# Patient Record
Sex: Male | Born: 1945
Health system: Southern US, Community
[De-identification: ages and names within clinical notes are randomized; demographics above are authoritative.]

## PROBLEM LIST (undated history)

## (undated) DIAGNOSIS — E785 Hyperlipidemia, unspecified: Secondary | ICD-10-CM

## (undated) DIAGNOSIS — K5792 Diverticulitis of intestine, part unspecified, without perforation or abscess without bleeding: Secondary | ICD-10-CM

## (undated) DIAGNOSIS — D126 Benign neoplasm of colon, unspecified: Secondary | ICD-10-CM

## (undated) DIAGNOSIS — I10 Essential (primary) hypertension: Secondary | ICD-10-CM

## (undated) DIAGNOSIS — C439 Malignant melanoma of skin, unspecified: Secondary | ICD-10-CM

## (undated) DIAGNOSIS — T7840XA Allergy, unspecified, initial encounter: Secondary | ICD-10-CM

## (undated) DIAGNOSIS — K5790 Diverticulosis of intestine, part unspecified, without perforation or abscess without bleeding: Secondary | ICD-10-CM

## (undated) DIAGNOSIS — M1990 Unspecified osteoarthritis, unspecified site: Secondary | ICD-10-CM

## (undated) DIAGNOSIS — Z87442 Personal history of urinary calculi: Secondary | ICD-10-CM

## (undated) DIAGNOSIS — C61 Malignant neoplasm of prostate: Secondary | ICD-10-CM

## (undated) DIAGNOSIS — F32A Depression, unspecified: Secondary | ICD-10-CM

## (undated) DIAGNOSIS — F329 Major depressive disorder, single episode, unspecified: Secondary | ICD-10-CM

## (undated) HISTORY — DX: Diverticulosis of intestine, part unspecified, without perforation or abscess without bleeding: K57.90

## (undated) HISTORY — PX: MELANOMA EXCISION: SHX5266

## (undated) HISTORY — DX: Benign neoplasm of colon, unspecified: D12.6

## (undated) HISTORY — DX: Unspecified osteoarthritis, unspecified site: M19.90

## (undated) HISTORY — DX: Major depressive disorder, single episode, unspecified: F32.9

## (undated) HISTORY — DX: Essential (primary) hypertension: I10

## (undated) HISTORY — PX: PROSTATE SURGERY: SHX751

## (undated) HISTORY — DX: Malignant neoplasm of prostate: C61

## (undated) HISTORY — DX: Malignant melanoma of skin, unspecified: C43.9

## (undated) HISTORY — PX: POLYPECTOMY: SHX149

## (undated) HISTORY — DX: Allergy, unspecified, initial encounter: T78.40XA

## (undated) HISTORY — PX: TOTAL KNEE ARTHROPLASTY: SHX125

## (undated) HISTORY — DX: Hyperlipidemia, unspecified: E78.5

## (undated) HISTORY — PX: HEMORRHOID SURGERY: SHX153

## (undated) HISTORY — PX: JOINT REPLACEMENT: SHX530

## (undated) HISTORY — DX: Diverticulitis of intestine, part unspecified, without perforation or abscess without bleeding: K57.92

## (undated) HISTORY — PX: EYE SURGERY: SHX253

## (undated) HISTORY — DX: Depression, unspecified: F32.A

## (undated) HISTORY — PX: COLONOSCOPY: SHX174

## (undated) HISTORY — PX: CYSTOSCOPY: SUR368

---

## 1997-10-21 ENCOUNTER — Ambulatory Visit (HOSPITAL_COMMUNITY): Admission: RE | Admit: 1997-10-21 | Discharge: 1997-10-21 | Payer: Self-pay | Admitting: Urology

## 1997-12-06 ENCOUNTER — Inpatient Hospital Stay (HOSPITAL_COMMUNITY): Admission: RE | Admit: 1997-12-06 | Discharge: 1997-12-09 | Payer: Self-pay | Admitting: Urology

## 2000-02-10 ENCOUNTER — Ambulatory Visit (HOSPITAL_BASED_OUTPATIENT_CLINIC_OR_DEPARTMENT_OTHER): Admission: RE | Admit: 2000-02-10 | Discharge: 2000-02-10 | Payer: Self-pay | Admitting: Internal Medicine

## 2000-08-27 ENCOUNTER — Encounter: Payer: Self-pay | Admitting: Orthopedic Surgery

## 2000-08-29 ENCOUNTER — Encounter: Payer: Self-pay | Admitting: Orthopedic Surgery

## 2000-08-29 ENCOUNTER — Observation Stay (HOSPITAL_COMMUNITY): Admission: RE | Admit: 2000-08-29 | Discharge: 2000-08-30 | Payer: Self-pay | Admitting: Orthopedic Surgery

## 2002-07-01 ENCOUNTER — Encounter: Payer: Self-pay | Admitting: Orthopedic Surgery

## 2002-07-09 ENCOUNTER — Inpatient Hospital Stay (HOSPITAL_COMMUNITY): Admission: RE | Admit: 2002-07-09 | Discharge: 2002-07-13 | Payer: Self-pay | Admitting: Orthopedic Surgery

## 2002-09-21 ENCOUNTER — Ambulatory Visit (HOSPITAL_COMMUNITY): Admission: RE | Admit: 2002-09-21 | Discharge: 2002-09-21 | Payer: Self-pay | Admitting: Orthopedic Surgery

## 2002-09-21 ENCOUNTER — Encounter: Payer: Self-pay | Admitting: Orthopedic Surgery

## 2003-01-21 ENCOUNTER — Ambulatory Visit (HOSPITAL_COMMUNITY): Admission: RE | Admit: 2003-01-21 | Discharge: 2003-01-21 | Payer: Self-pay | Admitting: Internal Medicine

## 2003-08-23 ENCOUNTER — Ambulatory Visit (HOSPITAL_COMMUNITY): Admission: RE | Admit: 2003-08-23 | Discharge: 2003-08-23 | Payer: Self-pay | Admitting: Orthopedic Surgery

## 2004-11-07 ENCOUNTER — Ambulatory Visit: Payer: Self-pay | Admitting: Internal Medicine

## 2004-12-01 ENCOUNTER — Ambulatory Visit: Payer: Self-pay | Admitting: Internal Medicine

## 2005-09-05 ENCOUNTER — Inpatient Hospital Stay (HOSPITAL_COMMUNITY): Admission: RE | Admit: 2005-09-05 | Discharge: 2005-09-08 | Payer: Self-pay | Admitting: Orthopedic Surgery

## 2005-09-05 ENCOUNTER — Encounter (INDEPENDENT_AMBULATORY_CARE_PROVIDER_SITE_OTHER): Payer: Self-pay | Admitting: *Deleted

## 2006-02-20 ENCOUNTER — Ambulatory Visit (HOSPITAL_COMMUNITY): Admission: RE | Admit: 2006-02-20 | Discharge: 2006-02-20 | Payer: Self-pay | Admitting: Orthopedic Surgery

## 2006-11-01 ENCOUNTER — Ambulatory Visit (HOSPITAL_BASED_OUTPATIENT_CLINIC_OR_DEPARTMENT_OTHER): Admission: RE | Admit: 2006-11-01 | Discharge: 2006-11-01 | Payer: Self-pay | Admitting: Orthopedic Surgery

## 2007-07-24 DIAGNOSIS — C61 Malignant neoplasm of prostate: Secondary | ICD-10-CM

## 2007-07-24 HISTORY — DX: Malignant neoplasm of prostate: C61

## 2007-07-24 HISTORY — PX: PROSTATE SURGERY: SHX751

## 2008-05-19 ENCOUNTER — Ambulatory Visit (HOSPITAL_BASED_OUTPATIENT_CLINIC_OR_DEPARTMENT_OTHER): Admission: RE | Admit: 2008-05-19 | Discharge: 2008-05-19 | Payer: Self-pay | Admitting: Orthopedic Surgery

## 2010-04-19 ENCOUNTER — Ambulatory Visit
Admission: RE | Admit: 2010-04-19 | Discharge: 2010-07-18 | Payer: Self-pay | Source: Home / Self Care | Attending: Radiation Oncology | Admitting: Radiation Oncology

## 2010-05-16 ENCOUNTER — Ambulatory Visit (HOSPITAL_COMMUNITY): Admission: RE | Admit: 2010-05-16 | Discharge: 2010-05-16 | Payer: Self-pay | Admitting: Urology

## 2010-08-09 LAB — COMPREHENSIVE METABOLIC PANEL
ALT: 22 U/L (ref 0–53)
AST: 20 U/L (ref 0–37)
Albumin: 4.3 g/dL (ref 3.5–5.2)
Alkaline Phosphatase: 53 U/L (ref 39–117)
BUN: 11 mg/dL (ref 6–23)
CO2: 34 mEq/L — ABNORMAL HIGH (ref 19–32)
Calcium: 10.2 mg/dL (ref 8.4–10.5)
Chloride: 97 mEq/L (ref 96–112)
Creatinine, Ser: 0.91 mg/dL (ref 0.4–1.5)
GFR calc Af Amer: >60 mL/min (ref >60)
GFR calc non Af Amer: >60 mL/min (ref >60)
Glucose, Bld: 156 mg/dL — ABNORMAL HIGH (ref 70–99)
Potassium: 3.8 mEq/L (ref 3.5–5.1)
Sodium: 141 mEq/L (ref 135–145)
Total Bilirubin: 0.6 mg/dL (ref 0.3–1.2)
Total Protein: 6.9 g/dL (ref 6.0–8.3)

## 2010-08-09 LAB — CBC
HCT: 42.7 % (ref 39.0–52.0)
Hemoglobin: 15 g/dL (ref 13.0–17.0)
MCH: 29.6 pg (ref 26.0–34.0)
MCHC: 35.1 g/dL (ref 30.0–36.0)
MCV: 84.4 fL (ref 78.0–100.0)
Platelets: 280 10*3/uL (ref 150–400)
RBC: 5.06 MIL/uL (ref 4.22–5.81)
RDW: 13.5 % (ref 11.5–15.5)
WBC: 7.7 10*3/uL (ref 4.0–10.5)

## 2010-08-09 LAB — PROTIME-INR
INR: 1.03 (ref 0.00–1.49)
Prothrombin Time: 13.7 seconds (ref 11.6–15.2)

## 2010-08-09 LAB — APTT: aPTT: 32 seconds (ref 24–37)

## 2010-08-14 ENCOUNTER — Ambulatory Visit
Admission: RE | Admit: 2010-08-14 | Discharge: 2010-08-14 | Payer: Self-pay | Source: Home / Self Care | Attending: Urology | Admitting: Urology

## 2010-08-16 ENCOUNTER — Ambulatory Visit
Admission: RE | Admit: 2010-08-16 | Discharge: 2010-08-16 | Payer: Self-pay | Source: Home / Self Care | Attending: Urology | Admitting: Urology

## 2010-08-28 NOTE — Op Note (Signed)
NAME:  Donald [name], Donald [name] NO.:  [id number]  MEDICAL RECORD NO.:  [id number]          PATIENT TYPE:  AMB  LOCATION:  [facility]                         FACILITY:  [facility]  PHYSICIAN:  Valetta Fuller, M.D.  DATE OF BIRTH:  [birth year]  DATE OF PROCEDURE: DATE OF DISCHARGE:                              OPERATIVE REPORT   PREOPERATIVE DIAGNOSIS:  Favorable clinical stage T1c adenocarcinoma of the prostate.  POSTOPERATIVE DIAGNOSIS:  Favorable clinical stage T1c adenocarcinoma of the prostate.  PROCEDURES PERFORMED: 1. Iodine-125 prostatic seed implantation via Careers adviser. 2. Flexible cystoscopy.  SURGEONS: 1. Valetta Fuller, M.D. 2. Maryln Gottron, M.D.  ANESTHESIA:  General.  INDICATIONS:  Donald [name] is 65 years of age.  The patient had a mildly elevated PSA with a family history of prostate cancer and a normal digital rectal exam.  The 2 out of 12 biopsy showed low-volume Gleason 3 plus 3 equals 6 cancer.  He was felt to have a low-volume Gleason 6 cancer with relatively low PSA.  He was felt to have favorable clinical stage T1c disease.  We discussed with the patient treatment options including active surveillance.  He was extremely anxious and nervous patient.  He did not feel comfortable without treatment.  We felt that seed implantation was a good compromise treatment for him compared to active surveillance versus robotic surgery.  He was felt to be an excellent candidate for seed implantation and elected to choose that modality.  He was seen in consultation with Dr. Dayton Scrape and after extensive discussion, elected to proceed with that having appeared to understand the advantages, disadvantages, and potential complications. The patient had been scheduled for seed implantation approximately 48 hours earlier but because of extensive stool within the rectal vault, the procedure cannot be done at that time.  He has subsequently done a more  complete bowel prep.  TECHNIQUE AND FINDINGS:  The patient was brought to the operating room. He had successful induction of general anesthesia.  He had PAS compression boots placed and received perioperative ciprofloxacin. Appropriate surgical time-out was performed.  Initial procedure was done by radiation oncology.  Transrectal ultrasound probe was placed in the rectum and anchored to a floor stand.  Foley catheter with contrast in the balloon was placed.  Anchor needles were placed in the prostate. Real time contouring of the rectum, prostate, and urethra were then performed and the dosing parameters established.  We then came to the operating suite and assisted in passage of the needles.  All needle passages were done with real time sagittal ultrasound guidance.  Seed implantation itself was done with the Psychologist, counselling.  A total of 25 activated needles were utilized with a total number of 81 seeds.  Reference dose was 145 Gy.  At the completion of the procedure, the distribution based on fluoroscopic interpretation was excellent.  Foley catheter was removed, and cystoscopy revealed no wayward seeds within the prostate or bladder.  A new Foley catheter was placed which drained clear urine, and the patient was brought to recovery room in stable condition.  Valetta Fuller, M.D.     DSG/MEDQ  D:  08/16/2010  T:  08/16/2010  Job:  [id number]  Electronically Signed by Barron Alvine M.D. on 08/28/2010 65:78:46 AM

## 2010-09-06 ENCOUNTER — Ambulatory Visit: Payer: BC Managed Care – PPO | Attending: Radiation Oncology | Admitting: Radiation Oncology

## 2010-09-06 DIAGNOSIS — C61 Malignant neoplasm of prostate: Secondary | ICD-10-CM | POA: Insufficient documentation

## 2010-09-14 ENCOUNTER — Ambulatory Visit: Payer: BC Managed Care – PPO | Attending: Radiation Oncology | Admitting: Radiation Oncology

## 2010-09-14 DIAGNOSIS — C61 Malignant neoplasm of prostate: Secondary | ICD-10-CM | POA: Insufficient documentation

## 2010-12-05 NOTE — Op Note (Signed)
NAME:  Donald [name], Donald [name]                ACCOUNT NO.:  [id number]   MEDICAL RECORD NO.:  [id number]          PATIENT TYPE:  AMB   LOCATION:  [facility]                         FACILITY:  [facility]   PHYSICIAN:  Ollen Gross, M.D.    DATE OF BIRTH:  March 08, 1946   DATE OF PROCEDURE:  05/19/2008  DATE OF DISCHARGE:                               OPERATIVE REPORT   PREOPERATIVE DIAGNOSES:  Left knee hypertrophic synovitis.   POSTOPERATIVE DIAGNOSES:  Left knee hypertrophic synovitis.   PROCEDURE:  Left knee arthroscopy with synovectomy.   SURGEON:  Ollen Gross, M.D.   ASSISTANT:  None.   ANESTHESIA:  General.   ESTIMATED BLOOD LOSS:  Minimal.   DRAINS:  None.   COMPLICATIONS:  None.   CONDITION:  Stable to recovery room.   INDICATIONS:  [name] is a 65 year old male, complex history in regards to  his left total knee.  He has had the development of hypertrophic  synovitis on two other occasions, treated with arthroscopy with  excellent symptomatic relief, but unfortunately, the tissue has built up  again. He presents now with hypertrophic synovitis with painful popping  of the knee.  He has not had effusions and has not had any signs of  infection.  He presents now for arthroscopy and debridement.   PROCEDURE IN DETAIL:  After successful administration of general  anesthetic, a tourniquet was placed high on the left thigh and left  lower extremity prepped and draped in the usual sterile fashion.  Standard superomedial and inferolateral incisions were made.  Inflow  cannula passed superomedial, camera passed inferolateral.   Arthroscopic visualization proceeds.  The patellar component is well-  seated.  There is a tremendous amount of hypertrophic tissue at the  junction of the quad tendon and the patellar component, as well as in  the medial and lateral gutters and just anterior to the component.  We  created a superolateral portal and placed a shaver in to debride the  hypertrophic  tissue, then used the ArthroCare to remove the remainder of  the hypertrophic tissue to completely clear out the medial and lateral  gutters, as well as the suprapatellar pouch.  In addition, we cleaned  out all the scar tissue in the infrapatellar fat pad and cleared out any  tissue that could impinge in the box of the femoral component.  The  joint was then thoroughly inspected again.  There was no evidence of any  other inflamed tissue and no evidence of any other hypertrophic scar.   At this point, the arthroscopic equipment was removed from the lateral  portals and they were closed with interrupted 4-0 nylon.  Then 20 mL of  0.25% Marcaine with epinephrine were injected through the inflow  cannula, then that was removed and that portal closed with nylon.  I  placed the knee through a range of motion.  There was no more crepitus  at all.  A bulky sterile dressing was then applied and he was placed  into a knee immobilizer, awakened and transported to recovery in stable  condition.      Homero Fellers  Aluisio, M.D.  Electronically Signed     FA/MEDQ  D:  05/19/2008  T:  05/19/2008  Job:  [id number]

## 2010-12-08 NOTE — Op Note (Signed)
NAME:  Donald [name], Donald [name] NO.:  [id number]   MEDICAL RECORD NO.:  [id number]          PATIENT TYPE:  AMB   LOCATION:  [facility]                         FACILITY:  [facility]   PHYSICIAN:  Ollen Gross, M.D.    DATE OF BIRTH:  12-Apr-1946   DATE OF PROCEDURE:  11/01/2006  DATE OF DISCHARGE:                               OPERATIVE REPORT   PREOPERATIVE DIAGNOSIS:  Patellar clunk syndrome, left knee.   POSTOPERATIVE DIAGNOSIS:  Patellar clunk syndrome, left knee.   PROCEDURE:  Left knee arthroscopic with synovectomy.   SURGEON:  Ollen Gross, M.D.   ASSISTANT:  No assistant.   ANESTHESIA:  General.   ESTIMATED BLOOD LOSS:  Minimal.   COMPLICATIONS:  None.   CONDITION:  Stable to recovery room.   BRIEF CLINICAL NOTE:  [name] is a 65 year old male with significant pain  and crepitus, left knee, now 1-1/2 years post total knee arthroplasty.  He had this similar presentation last year.  We did a synovectomy.  He  was asymptomatic for approximately a year and then the popping and pain  recurred.  He presents now for arthroscopy and synovectomy.   PROCEDURE IN DETAIL:  After successful administration of general  anesthetic, a tourniquet was placed high on the left thigh.  The left  lower extremity prepped and draped in the usual sterile fashion.  Standard superomedial and inferolateral incisions were made.  Inflow  canula was passed superomedial, the cameral was passed inferolateral.  Arthroscopic visualization then proceeds.  The junction between the  patellar component and the quad tendon shows an area of very thickened  hypertrophic synovium which is hanging down in the space between the  patellar component and the femoral component, thus causing this  crepitus.  I used the superomedial portal and isolated it with the  spinal needle.  A small incision was made and then this hypertrophic  synovium was debrided back to the quad tendon utilizing a combination of  a 4.2-mm  shaver and the ArthroCare device.  We then inspected the rest  of the joint.  There was a little scar tissue in the intercondylar notch  and that was debrided also with the shaver.  The rest of the joint  looked fine.  We then removed the arthroscopic equipment from the medial  portals and closed them  with interrupted 4-0 nylon.  Twenty cubic centimeters of 0.25% Marcaine  with epinephrine injected through the inflow canula, then that was  removed and that portal closed with nylon.  A bulky sterile dressing was  then applied, and he was awakened and transported to recovery in stable  condition.      Ollen Gross, M.D.  Electronically Signed     FA/MEDQ  D:  11/01/2006  T:  11/01/2006  Job:  [id number]

## 2010-12-08 NOTE — Discharge Summary (Signed)
NAME:  Donald [name], Donald [name]                          ACCOUNT NO.:  [id number]   MEDICAL RECORD NO.:  [id number]                   PATIENT TYPE:  INP   LOCATION:  0471                                 FACILITY:  [facility]   PHYSICIAN:  Vania Rea. Supple, M.D.               DATE OF BIRTH:  [birth year]   DATE OF ADMISSION:  07/09/2002  DATE OF DISCHARGE:  07/13/2002                                 DISCHARGE SUMMARY   ADMISSION DIAGNOSES:  1. End-stage osteoarthrosis to the left knee.  2. Hypertension.  3. Renal calculi.   DISCHARGE DIAGNOSES:  1. End-stage osteoarthrosis to the left knee.  2. Hypertension.  3. Renal calculi.  4. Status post left total knee arthroplasty.  5. Mild postoperative hemorrhagic anemia not requiring transfusion.   OPERATION:  Left total knee arthroplasty.  Surgeon:  Vania Rea. Supple, M.D.  Assistant:  Lucita Lora. Shuford, P.A.-C.  Anesthesia:  General.   HISTORY OF PRESENT ILLNESS:  The patient is a 65 year old male who has had  refractory left knee pain due to osteoarthrosis.  He has been tried on all  conservative measures.  X-rays have shown bone-on-bone changes, and he is  having significant complications with ADLs and pain control.  At this time  total knee replacement arthroplasty was indicated.  The risks and benefits  were discussed at length with the patient.  He wishes to proceed.   HOSPITAL COURSE:  The patient was admitted and underwent the above-noted  procedure and tolerated this well.  All appropriate IV antibiotics and  analgesics were utilized.  Postoperatively he was placed on Coumadin for DVT  and PE prophylaxis.  He was also placed on weightbearing as tolerated.  Hemovac that was placed intraoperatively was removed on postoperative day  #1.  This was done without complications.  Overall, the patient did  extremely well.  He did have a history of sleep apnea; however, his  saturations remained stable on just nasal cannula O2.  His hemoglobin  dropped  to 9.0 postoperatively, and he was asymptomatic.  All in all, the  patient began to progress nicely with therapy.  On postoperative day #2 all  IVs were discontinued.  His medications had to be changed around due to some  nausea associated with Percocet.  Mepergan Fortis and Vicodin were utilized,  and these were more tolerated.  By date July 13, 2002, he had reached  all therapy goals.  He was voiding and had had a bowel movement.  He was  having no further nausea or vomiting.  His t-max was 100.  Incision was  clean and dry, and all other temperatures have been afebrile.  He has  minimal swelling and tolerating p.o. analgesics.  At this time he was felt  stable for discharge to home.  Home health PT and OT and R.N.   LABORATORY DATA:  Admission laboratories:  Hemoglobin 13.9, dropped to 9.0,  and then 9.2 on last draw.  Protimes, INRs followed by pharmacy on Coumadin  for DVT and PE prophylaxis.  See chart for details.  Urinalysis on admission  within normal limits.   EKG on admission:  Normal sinus rhythm.  No significant abnormalities.   Chest x-ray shows no evidence of acute cardiopulmonary disease.  Vague  pleural plaques noted, unchanged from August 27, 2001.  The knee showed  tricompartmental osteoarthritis.   CONDITION ON DISCHARGE:  Stable and improved.   DISPOSITION:  The patient is being discharged to home in the care of his  family.   FOLLOW-UP:  He will follow up two weeks postoperatively.  Call for time.   DISCHARGE INSTRUCTIONS:  Home health PT, OT were arranged.   MEDICATIONS:  1. Resume home medications.  2. Prescriptions were provided for:     A. Coumadin per pharmacy.     B. Vicodin #40, 1-2 every four to six hours p.r.n. pain.     C. Robaxin 500 mg 1 every eight hours p.r.n. spasm, #30.     D. Restoril 15 mg, #30, 1 for sleep.   DIET:  Resume regular diet.      Tracy A. Shuford, P.A.-C.                 Vania Rea. Supple, M.D.    TAS/MEDQ  D:   08/05/2002  T:  08/05/2002  Job:  [id number]

## 2010-12-08 NOTE — Op Note (Signed)
[facility]  Patient:    Donald [name], Donald [name]                       MRN: [id number] Proc. Date: 08/29/00 Adm. Date:  [id number] Disc. Date: [id number] Attending:  Cain Sieve                           Operative Report  PREOPERATIVE DIAGNOSIS:  Left knee medial meniscus tear.  POSTOPERATIVE DIAGNOSES: 1. Left knee medial meniscus tear. 2. Left knee lateral meniscus tear. 3. chondromalacia in all three compartments of the left knee. 4. Diffuse synovitis and multiple intra-articular chondral loose bodies.  OPERATION: 1. Left knee diagnostic arthroscopy. 2. Partial medial meniscectomy. 3. Partial lateral meniscectomy. 4. Tricompartmental chondroplasties. 5. Partial synovectomy and removal of multiple intra-articular cartilaginous    loose bodies.  SURGEON:  Vania Rea. Supple, M.D.  ANESTHESIA:  Laryngeal mask general.  TOURNIQUET TIME:  None was used.  ESTIMATED BLOOD LOSS:  Minimal.  HISTORY:  [name] is an 65 year old gentleman who has had persistent difficulties with left knee pain and mechanical symptoms with clinical examination showing joint line tenderness as well as mild effusion.  MRI scan was performed showing an apparent degenerative tear of the medial meniscus. Due to his ongoing pain and functional limitations, he is brought to the operating room at this time for planned arthroscopic debridement.  Preoperatively, he had been counselled on treatment options as well as risks versus benefits thereof.  Possible complications of bleeding, infection, neurovascular injury, DVT, PE, persistence of pain were all reviewed.  We also discussed arthroscopic surgery would not change his underlying osteoarthrosis. He understands and accepts and agrees with the planned procedure.  DESCRIPTION OF PROCEDURE:  After undergoing routine preoperative evaluation, the patient had a knee block placed in the holding area by the anesthesia department.  He  was then placed supine on the operating table where he underwent induction of laryngeal mask general anesthesia.  The left leg was placed in leg holder and sterilely prepped and draped in standard fashion.  He received 1 g of IV cephalosporin prophylactically.  Standard portals were established, and diagnostic arthroscopy was performed.  Suprapatellar pouch showed diffuse synovitis and multiple chondral loose bodies which were both floating in the joint as well as multiple small pieces which had become attached to the synovial lining.  A shaver was brought in and used to debride these small fragments free from the synovial lining in both the medial and lateral gutters and suprapatellar pouch and diffusely in the anterior chamber. Patellofemoral joint showed diffuse grade III chondromalacia, and this was debrided with the shaver down to stable cartilaginous space.  Intercondylar notch showed intact ligamenta mucosum which was excised with good visualization.  ACL was stable to probing with a negative intraoperative Lachman.  In the medial compartment, there was an extensive degenerative tear of the middle and posterior thirds of the medial meniscus, and this was trimmed back to a stable peripheral margin with the basket, and the shaver was brought in for a final contouring and removal of the meniscal fragments. There was an area of exposed subchondral bone on the posterior aspect of the medial tibial plateau, and this was debrided with the shaver.  Diffuse chondromalacia on the mediofemoral condyle was also debrided.  Laterally, there was an extensive degenerative tear involving the middle and posterior thirds of the lateral meniscus, and this was also trimmed back  to stable peripheral margin with the basket, and the shaver was brought in for final contouring and removal of the meniscal fragments.  Inspection of the posterior medial compartment showed no obvious additional meniscal pathology  or retained fragments.  At this point, a copious irrigation and flushing of the joint was performed to remove any residual chondral loose bodies.  There was some mild chondromalacia on the lateral femoral condyle, and this was also debrided with the shaver as well as small area on the lateral tibial plateau.  At this point, all fluid and instruments were then removed.  The knee joint was instilled with 20 cc of 0.5% Marcaine with epinephrine, 4 mg of morphine and an additional 10 cc of Marcaine with epinephrine was instilled about the portals.  The portals were closed with Steri-Strips; 4 x 4s and ABDs were then wrapped about the knee with cast padding.  The leg was wrapped with an Ace bandage from foot to thigh.  The patient was transferred to the recovery room in stable condition.DD:  08/29/00 TD:  08/30/00 Job: [id number]

## 2010-12-08 NOTE — Discharge Summary (Signed)
NAME:  Donald, [name] NO.:  [id number]   MEDICAL RECORD NO.:  [id number]          PATIENT TYPE:  INP   LOCATION:  1518                         FACILITY:  [facility]   PHYSICIAN:  Ollen Gross, M.D.    DATE OF BIRTH:  [birth year]   DATE OF ADMISSION:  09/05/2005  DATE OF DISCHARGE:  09/08/2005                                 DISCHARGE SUMMARY   ADMISSION DIAGNOSES:  1.  Instability, left total knee arthroplasty.  2.  Hypertension.  3.  Hypercholesterolemia.  4.  History of melanoma.  5.  Nephrolithiasis.  6.  Glaucoma.  7.  Impaired fasting glucose.  8.  Erectile dysfunction.  9.  History of colon polyps, status post polypectomy.  10. Mild benign prostatic hypertrophy.   DISCHARGE DIAGNOSES:  1.  Unstable, loose left total knee, status post left total knee      arthroplasty revision.  2.  Postoperative hypokalemia, improving.  3.  Mild postoperative hyponatremia.  4.  Hypertension.  5.  Hypercholesterolemia.  6.  History of melanoma.  7.  Nephrolithiasis.  8.  Glaucoma.  9.  Impaired fasting glucose.  10. Erectile dysfunction.  11. History of colon polyps, status post polypectomy.  12. Mild benign prostatic hypertrophy.   PROCEDURE:  September 05, 2005, left total knee arthroplasty revision,  surgeon Dr. Lequita Halt, assistant Avel Peace, PA-C.  Anesthesia general with  postop Marcaine pain pump, minimal blood loss.  Tourniquet time 45 minutes,  300 mmHg, down for 10, up an additional 21 minutes at 300 mmHg.   CONSULTS:  None.   BRIEF HISTORY:  Donald [name] is a 64 year old male, who is approximately 3-4 years  out from a left total knee arthroplasty.  He has had recurrent effusions  with significant pain.  Infection has been ruled out on multiple occasions  with bone scan and fluid aspirates.  He does have some instability,  especially in flexion, now presents for revision versus resection  arthroplasty.   LABORATORY DATA:  Preop CBC:  Hemoglobin 14.6,  hematocrit 42.2, normal white  count at 6.3.  Postop hemoglobin 11.8, drifted down to 10.9, last noted H&H  back up to 11.6 and 34.1.  PT/PTT preop 13.9 and 28, respectively and INR of  1.1.  Serial pro times followed.  Last noted PT/INR 26.5, 2.4.  Chem panel  on admission all within normal limits.  Potassium dropped from 3.8 to 3.0,  back up to 3.4.  Sodium dropped from 138 to 136, last noted at 133.  Preop  UA negative.  Blood group/type A positive.  A stat Gram stain taken at time  of surgery, no organisms seen.  Wound culture taken at time of OR September 05, 2005.  Smear showed no organisms, no growth on the culture.  Anaerobic  culture taken at time of surgery, September 05, 2005, Gram smear showed no  organisms with no anaerobes isolated.  Two view chest, August 31, 2005,  suboptimal inspiration, no acute cardiopulmonary disease.   HOSPITAL COURSE:  Admitted to [facility], tolerated the procedure  well, later transferred to the recovery room  and the orthopedic floor, had a  fair amount of pain through the night, was using PCA, started to encourage  p.o. pain medications.  Made sure he was on high dose PCA.  Hemoglobin  looked good postoperatively, and the Hemovac had pulled out and had some  drainage on the dressing. Dressing was changed.  Incision actually looked  very good.  He was diuresing the fluid well. He did have a drop in his  potassium down to 3.0, placed on K-Dur, by the following day was doing a  little bit better with his pain control and actually got up and ambulated 60  feet and later 100 feet.  The pain was improving with p.o. medications.  His  potassium was back up to 3.7.  Pain pump was removed, discontinued his IVs  and Foley.  Continued to progress well with physical therapy and by the  following day of February 17, he was doing well, taking medications by mouth  and was discharged home.   DISCHARGE PLAN:  1.  The patient discharged home on September 08, 2005.  2.  Discharge diagnoses, please see above.  3.  Discharge medications:  Coumadin, Percocet, Robaxin, Restoril.  4.  Diet:  Resume previous home diet.   ACTIVITY:  Weightbearing as tolerated, total knee protocol, home health PT,  home health nursing.   DISPOSITION:  Home.   CONDITION ON DISCHARGE:  Improving.      Alexzandrew L. Julien Girt, P.A.      Ollen Gross, M.D.  Electronically Signed    ALP/MEDQ  D:  10/11/2005  T:  10/12/2005  Job:  [id number]   cc:   Loraine Leriche A. Perini, M.D.  Fax: [phone]

## 2010-12-08 NOTE — Op Note (Signed)
NAME:  CHUNG, CHAGOYA NO.:  [id number]   MEDICAL RECORD NO.:  [id number]          PATIENT TYPE:  AMB   LOCATION:  DAY                          FACILITY:  [facility]   PHYSICIAN:  Ollen Gross, M.D.    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   POSTOPERATIVE DIAGNOSIS:  Left knee patellar clunk syndrome with  hypertrophic synovitis.   OPERATION PERFORMED:  Left knee arthroscopy with synovectomy.   SURGEON:  Ollen Gross, M.D.   ANESTHESIA:  General.   ESTIMATED BLOOD LOSS:  Minimal.   DRAINS:  None.   COMPLICATIONS:  None.  Condition stable to recovery.   INDICATIONS FOR PROCEDURE:  Nikesh is a 65 year old male with left total knee  arthroplasty revision approximately six months ago.  He has been doing well  from a pain standpoint but has had a marked crepitance on range of motion of  the knee.  This is all consistent with the patellar clunk syndrome. He  presents now for arthroscopy and debridement.   DESCRIPTION OF PROCEDURE:  After successful administration of general  anesthetic, a tourniquet was placed high on his left thigh and left lower  extremity prepped and draped in the usual sterile fashion.  Standard  superomedial and inferolateral incisions were made.  An inflow cannula  passed superomedial, camera passed inferolateral, arthroscopic visualization  proceeds.  He had evidence of a large synovitic nodule at the junction of  the quadriceps tendon and patellar component.  I subsequently switched the  inflow through an inferomedial portal and placed the ArthroCare device in  the superomedial portal.  Using a combination of the ArthroCare device and  the 4.2 mm shaver, I was able to debride this back to normal-appearing  quadriceps tendon.  We then sealed that off with the  ArthroCare.  I then  removed the arthroscopic equipment and placed him through a range of motion  and there was no further crepitance.  I again inspected the knee and there  was some more synovitic type tissue just adjacent to the  patella laterally and medially which was debrided.  We subsequently removed  the arthroscopic equipment and injected 20 mL of 0.25% Marcaine with  epinephrine into the joint.  The incisions were closed with interrupted 4-0  nylon and a bulky sterile dressing was applied.  He was awakened and  transported to recovery in stable condition.      Ollen Gross, M.D.  Electronically Signed     FA/MEDQ  D:  02/20/2006  T:  02/21/2006  Job:  [id number]

## 2010-12-08 NOTE — Op Note (Signed)
NAME:  CLEMENS, LACHMAN NO.:  [id number]   MEDICAL RECORD NO.:  [id number]          PATIENT TYPE:  INP   LOCATION:  0009                         FACILITY:  [facility]   PHYSICIAN:  Ollen Gross, M.D.    DATE OF BIRTH:  Dec 17, 1945   DATE OF PROCEDURE:  09/05/2005  DATE OF DISCHARGE:                                 OPERATIVE REPORT   PREOPERATIVE DIAGNOSIS:  Unstable loose left total knee arthroplasty.   POSTOPERATIVE DIAGNOSIS:  Unstable loose left total knee arthroplasty.   PROCEDURE:  Left total knee arthroplasty revision.   SURGEON:  Ollen Gross, M.D.   ASSISTANT:  Alexzandrew L. Julien Girt, P.A.   ANESTHESIA:  General with postop Marcaine pain pump.   ESTIMATED BLOOD LOSS:  Minimal.   DRAINS:  Hemovac x1.   TOURNIQUET TIME:  45 minutes at 300 mmHg, down 10 minutes and then up an  additional 21 minutes at 300 mmHg.   COMPLICATIONS:  None.   CONDITION:  Stable to recovery.   CLINICAL NOTE:  Jamile is a 65 year old male whose approximately 3-4 years  out from a left total knee arthroplasty. He has had problems with recurrent  effusions and significant pain. Infection has been ruled out on multiple  occasions with bone scan and fluid aspirates. He does have some instability  especially in flexion. He presents now for total knee arthroplasty revision  first versus resection arthroplasty depending on intraoperative findings.   PROCEDURE IN DETAIL:  After successful administration of general anesthetic  a tourniquet was placed high on the left thigh and left lower extremity  prepped and draped in the usual sterile fashion. He did not receive  preoperative antibiotics so we can get an accurate culture. The extremity is  wrapped in Esmarch, knee flexed, tourniquet inflated to 300 mmHg. A midline  incision is made with a 10 blade through the subcutaneous tissue to the  level of the extensor mechanism. A fresh blade is used to make a medial  parapatellar  arthrotomy. A moderate amount of clear synovial fluid is  identified and sent for stat Gram stain which was negative. The soft tissue  over the proximal medial tibia is subperiosteally elevated to the joint line  with a knife and into the semimembranosus bursa with a Cobb elevator. The  lateral tissue is also elevated with attention being paid to avoiding the  patellar tendon on the tibial tubercle. The patella was everted, knee flexed  90 degrees. I removed the scar from the suprapatellar pouch. Synovium is  also sent for stat frozen section which also showed no acute inflammation.  The tibial polyethylene is removed. On the femoral side, I easily disrupted  the interface between the prosthesis and bone and removed it with minimal if  any bone loss. On the tibial side, the component was found to be loose. We  disrupted the interface and easily removed the tibial component. There was  some motion present and easy removal was achieved. The cement is then  removed from the tibial canal. Both the femoral and tibial canals are  thoroughly irrigated. We reamed on  the tibial side up to 16 mm and on the  femoral side up to 22 mm.   The tibia subluxed forward and the 16 mm reamer placed. The intramedullary  cutting guide is placed to remove approximately 2-3 mm of bone off the cut  surface. The proximal tibia is then prepared with the proximal drill/reamer.  This was for the MBT revision tray. We placed a trial size 4 tray with a 13  x 60 stem extension and had good fit. We then did the keel punch for that.   On the femoral side, we placed a 22 mm intramedullary reamer and did our  cutting off of that. A distal femoral cutting block is placed to remove  about 2 mm off the distal femur. This was done in 5 degrees of valgus. We  placed a 12.5 mm spacer block and then placed the AP cutting block for the  size 4. We used a spacer block to gauge our rotation on the femoral side to  create a  rectangular flexion gap. We placed the stem in a +2 position to  effectively raise the stem and lower the anterior flange of the component.  Anterior, there is no bone removed and posterior we did remove some bone,  especially posterolateral. The revision block is then placed and the chamfer  and intercondylar cut made.   We set our trials with the tibial trial being a size 4 MBT revision tray  with a 13 x  60 stem extension. On the femoral side, it was a size four  posterior stabilized femur with the adapter in the +2 position and the 22 x  75 stem extension placed. We got up to a 22.5 spacer which I did not want to  go that high so we placed 10 mm augments on the medial and lateral side on  the tibia. When we did this, we retrialed and did a 12.5 spacer with  outstanding balance in full extension through 125-135 degrees of flexion. .  I just had to remove some bone around the patella and smooth out the tissue  adjacent to it and thus performed patellaplasty. The component was in good  position and was not worn. We then released the tourniquet for the first  tourniquet time of 45 minutes. The tourniquet was down for 10 minutes while  we assembled the components on the back table. After 10 minutes, we  rewrapped in Esmarch and reinflated to 300 mmHg. I trialed the cement  restricter for the tibial side and size 6 was the most appropriate. It was  placed at the appropriate depth. The pulsatile lavage was down, he received  a gram of IV Ancef. The cement is mixed and once ready for implantation, it  is injected into the tibial canal and pressurized. The tibial component is  then cemented into place and then the femoral component is cemented distally  with a Press-Fit stem. Both components are impacted, a trial 12.5 insert is  placed, knee held in full extension and all extruded cement removed. Once  the cement is fully hardened then the permanent 12.5 mm posterior stabilized rotating  platform insert is placed in the tibial tray. Once again, there is  outstanding stability throughout full range of motion. Flexion against  gravity showed about 140 degrees with excellent tracking of the patella. The  wound is then copiously irrigated with saline solution. The extensor  mechanism closed against gravity as stated is about 140 degrees. Subcu is  closed with interrupted 2-0 Vicryl and skin with staples. A catheter for the  Marcaine pain pump is placed and the pump was initiated. Hemovac is hooked  to suction, a bulky sterile dressing applied and he is placed into a knee  immobilizer, awakened and transported to recovery in stable condition.      Ollen Gross, M.D.  Electronically Signed     FA/MEDQ  D:  09/05/2005  T:  09/06/2005  Job:  [id number]

## 2010-12-08 NOTE — H&P (Signed)
NAME:  Donald [name], Donald [name] NO.:  [id number]   MEDICAL RECORD NO.:  [id number]          PATIENT TYPE:  INP   LOCATION:  NA                           FACILITY:  [facility]   PHYSICIAN:  Ollen Gross, M.D.    DATE OF BIRTH:  November 10, 1945   DATE OF ADMISSION:  09/05/2005  DATE OF DISCHARGE:                                HISTORY & PHYSICAL   CHIEF COMPLAINT:  Left knee pain.   HISTORY OF PRESENT ILLNESS:  A 65 year old male well known to Dr. Ollen Gross, having previously undergone a left total knee angioplasty.  Unfortunately, the left knee has developed a great deal of instability.  He  has been workup up for infection several times in the past which have all  been negative, although he continues to have moderate instability.  He  continues to have an effusion.  It was initially felt he either had a low-  grade infection or instability.  He has reached a point where he is having  difficulty with mobility and transfer and also continued difficulty with  effusions and swelling at the end of the day.  He has reached a point where  he would like to have something done about it.  Risks and benefits of  revision have been discussed with him.  The patient does understand the  possibility of infection requiring resection arthroplasty in two-stage  procedure versus full revision for instability.  Risks and benefits  discussed.  The patient will subsequently be admitted to hospital.  Seen by  Dr. Waynard Edwards preoperatively and considered low risk for perioperative  cardiovascular event.   ALLERGIES:  No known drug allergies.   CURRENT MEDICATIONS:  1.  Lipitor 10 mg.  2.  Lotrel.  3.  He is also on a diuretic.  4.  Aspirin 1 daily.  5.  Vicodin.   PAST MEDICAL HISTORY:  1.  Hypertension.  2.  Hypercholesterolemia.  3.  History of melanoma excision.  4.  Nephrolithiasis.  5.  Glaucoma.  6.  Impaired fasting glucose.  7.  Erectile dysfunction.  8.  Colon polyps.  9.  Mild  BPH.  10. History of immunotherapy after melanoma excision.   PAST MEDICAL HISTORY:  1.  Surgical excision of melanoma.  2.  Colonoscopy.  3.  Polypectomy.  4.  Left total knee angioplasty.   SOCIAL HISTORY:  Married, 2 children.  Denies tobacco products, seldom  intake of alcohol.  He owns a Engineer, drilling.   FAMILY HISTORY:  Father deceased with history of AAA. Uncle deceased with  history of AAA.  Mother living at age 69 with history of MI.   REVIEW OF SYSTEMS:  GENERAL: No fever or night sweats. NEUROLOGIC: No  seizures, stroke, paralysis. RESPIRATORY: No shortness of breath, productive  cough, hemoptysis. CARDIOVASCULAR: No chest pain, orthopnea. GI: No nausea,  vomiting, diarrhea, constipation. GU: No dysuria, hematuria, discharge.  MUSCULOSKELETAL: Left knee.   PHYSICAL EXAMINATION:  VITAL SIGNS: Pulse 84, respirations 12, blood  pressure 148/82.  GENERAL: The patient is a 65 year old white male, well-nourished, well-  developed, no acute  distress.  He is alert, oriented, cooperative, good  historian.  Has somewhat of a flat affect during the exam.  HEENT:  Normocephalic and atraumatic.  Pupils equal, round, and reactive.  Oropharynx clear. EOMs intact.  NECK:  Supple.  CHEST: Clear.  HEART:  Regular rate and rhythm, no murmur.  ABDOMEN: Soft, nontender.  Bowel sounds present.  RECTAL/BREASTS/GENITALIA: Not done, not pertinent to present illness.  EXTREMITIES:  Left knee range of motion 0 to 120 degrees, moderate  instability, more in flexion than extension.  He has a lot of AP play in  flexion.   IMPRESSION:  1.  Instability left total knee angioplasty.  2.  Hypertension.  3.  Hypercholesterolemia.  4.  History of melanoma.  5.  Nephrolithiasis.  6.  Glaucoma.  7.  Impaired fasting glucose.  8.  Erectile dysfunction.  9.  History of colon polyps status post polypectomy.  10. Mild benign prostatic hypertrophy.   PLAN:  Patient admitted to [facility] to  undergo a left total knee  resection versus left total knee angioplasty revision.  Surgery will be  performed by Dr. Ollen Gross.      Alexzandrew L. Julien Girt, P.A.      Ollen Gross, M.D.  Electronically Signed    ALP/MEDQ  D:  09/04/2005  T:  09/04/2005  Job:  [id number]   cc:   Loraine Leriche A. Perini, M.D.  Fax: [phone]

## 2010-12-08 NOTE — Op Note (Signed)
[facility]  Patient:    Donald [name], Donald [name]                       MRN: [id number] Proc. Date: 08/29/00 Adm. Date:  [id number] Disc. Date: [id number] Attending:  Cain Sieve                           Operative Report  ADDENDUM  DESCRIPTION OF PROCEDURE:  Was dictated:  There was exposed subchondral bone on the medial tibial plateau.  This should be corrected to read that there was grade III chondromalacia of the medial tibial plateau.  There was, however, no obvious exposed subchondral bone. DD:  08/29/00 TD:  08/30/00 Job: [id number]

## 2010-12-08 NOTE — Op Note (Signed)
NAME:  Donald [name], Donald [name]                          ACCOUNT NO.:  [id number]   MEDICAL RECORD NO.:  [id number]                   PATIENT TYPE:  INP   LOCATION:  X001                                 FACILITY:  [facility]   PHYSICIAN:  Vania Rea. Supple, M.D.               DATE OF BIRTH:  April 16, 1946   DATE OF PROCEDURE:  07/09/2002  DATE OF DISCHARGE:                                 OPERATIVE REPORT   PREOPERATIVE DIAGNOSIS:  End-stage left knee osteoarthrosis.   POSTOPERATIVE DIAGNOSIS:  End-stage left knee osteoarthrosis.   PROCEDURE:  A cemented [employer]ics Scorpio posterior-stabilized total knee  arthroplasty, utilizing a size 9 femur, a size 9 tibia, a 12 mm thick  polyethylene insert, and a 26 mm patella.   SURGEON OF RECORD:  Vania Rea. Supple, M.D.   Threasa HeadsFrench Ana A. Shuford, P.A.-C.   ANESTHESIA:  General endotracheal.   TOURNIQUET TIME:  60 minutes.   ESTIMATED BLOOD LOSS:  200 cc.   DRAINS:  Hemovac x 1.   HISTORY:  Mr. Mulrooney is a 65 year old gentleman, who has had a long history  of left knee pain, swelling, and mechanical symptoms with known  osteoarthrosis based on previous arthroscopic findings.  He is now to the  point where the significant pain and functional limitation are causing a  marked diminution in his quality of life, and he is brought to the operating  room at this time for planned left total knee arthroplasty.   Preoperatively, Mr. Mcquitty had been counseled on treatment options as well  as risks versus benefits thereof.  Possible surgical complications of  bleeding, infection, neurovascular injury, DVT, PE, as well as persistence  of pain reviewed.  He understands and accepts and agrees with our planned  procedure.   PROCEDURE IN DETAIL:  After undergoing routine preoperative evaluation, the  patient did receive prophylactic antibiotics.  Placed supine on the  operating table and underwent smooth induction of general endotracheal  anesthesia.  Foley  catheter was placed.  Tourniquet applied to left thigh  and left leg sterilely prepped and draped in the standard fashion.  The leg  was exsanguinated with the tourniquet inflated to 350 mmHg.  An anterior  midline incision was then made from 4 fingerbreadths above the patella to  just medial to the tibial tubercle for a total length of approximately 20  cm.  Skin flaps were elevated medially and laterally and then tied back.  Electrocautery was used for hemostasis.  A medial parapatellar arthrotomy  was then made with a Bovie, and the patella was everted, and approximately  half the fat pad was excised.  The knee was then flexed up, and the remnants  of the medial lateral menisci were removed.  ACL and PCL were also removed  with the Bovie.  A drill was then used to gain access into the femoral  intramedullary canal, followed by canal finder, and  then intramedullary  guide was placed.  We removed 1 cm from the distal femur with a 5 degree  valgus alignment.  The distal femoral sizing guide was then placed, and the  size 9 had the best AP and ML fit, and we also applied 3 degrees of external  rotation to the implant.  The distal femoral cutting guide was then placed,  and we made the anterior, posterior, and chamfer cuts obtaining good bony  surfaces.  The trochlear groove cutting guide was then placed, and then we  cut the trochlear groove as well as the box cut on the distal femur.  The  trial size 9 left distal femur was then placed.  The knee was then flexed  up, and the proximal tibia was exposed with McHale retractors.  The tibial  spines were removed, and then a drill was used to gain access into the  tibial medullary canal.  Intramedullary cut guide was then placed and based  on a five-degree posterior slope, we made a cut on the proximal tibia,  removing 6 mm from the defect on the medial tibial plateau.  We then  carefully removed residual meniscal tissue from the posterior aspect  of the  joint.  A trial reduction was performed, and a size 9 tibial tray had the  best coverage of the proximal tibia.  Appropriate rotation of the tibial  tray was then determined and marked.  The  knee was then flexed up and  utilizing the keel cutting broach, we broached to the appropriate size keel  for the size 9 tray.  At this point, the trial tibia and femur were removed.  Our attention was directed to the patella where the peripheral osteophytes  were removed with a rongeur.  The recess patellar cutting guide was then  placed, and a 26 mm patella was reamed down to the appropriate depth.  The  stabilizing drill holes were then placed.  Trial patella button was placed  and rongeur was then used to smooth the contours of the patella.  At this  point, the cement was mixed at the back table.  The knee joint was  pulsatilely lavaged and meticulously cleaned.  At the appropriate  consistency, the cement was then introduced to cement the tibia and then  femur and then patella in that order.  We meticulously removed all extra  cement.  Once the cement had hardened, we then inspected all aspects of the  joint and removed any residual cement.  I should also mention that we used  an osteotome to remove some osteophytes from the posterior femoral condyles  prior to placement of the implants.  We then performed a trial reduction  with a size 10 and 12 mm thick inserts, and the 12 mm thick insert had the  best soft tissue balance.  We removed the trial tibial tray and meticulously  cleaned the tibial insert and then placed the final polyethylene tray.  Range of motion showed excellent flexion, but there was a slight tendency  towards lateral patella tracking, so we did perform a lateral release.  Once  this was completed, we then let down the tourniquet.  Hemostasis was  obtained.  A Hemovac drain was brought out superolaterally.  The parapatellar arthrotomy was then closed with interrupted  figure-of-eight #1  Vicryl sutures; 2-0 Vicryl used to close the subcu, and 3-0 Monocryl  intracuticular used for the skin followed by Steri-Strips.  Bulky dry  dressing was then applied.  A combination of Marcaine saline was instilled  into the knee through the drain at the end of the case.  Bulky dry dressing  was then applied.  Knee immobilizer placed.  The patient was then extubated  and taken to the recovery room in stable condition.                                               Vania Rea. Supple, M.D.    KMS/MEDQ  D:  07/09/2002  T:  07/09/2002  Job:  [id number]

## 2010-12-08 NOTE — H&P (Signed)
NAME:  Donald [name], Donald [name]                          ACCOUNT NO.:  [id number]   MEDICAL RECORD NO.:  [id number]                   PATIENT TYPE:  INP   LOCATION:  NA                                   FACILITY:  [facility]   PHYSICIAN:  Vania Rea. Supple, M.D.               DATE OF BIRTH:  1946-07-07   DATE OF ADMISSION:  07/09/2002  DATE OF DISCHARGE:                                HISTORY & PHYSICAL   CHIEF COMPLAINT:  Left knee pain.   HISTORY OF PRESENT ILLNESS:  The patient is a 65 year old male who has had  progressive and end-stage osteoarthritis to the left knee.  He has had a  conservative course including knee arthroscopy as well as injections of  Synvisc as well as cortisone.  He is having significant pain that is  affecting his activities of daily living.  He is unable to do things he  actually enjoys in life.  At this time his pain is such that he wishes to  proceed with total knee replacement.  X-rays have confirmed advanced medial  compartment osteoarthrosis, bone-on-bone, and patellofemoral and lateral  compartment arthrosis as well.  With these findings, total knee replacement  is indicated.  The risks and benefits were discussed at length with the  patient, and he is in agreement and wishes to proceed.   PAST MEDICAL HISTORY:  1. Osteoarthritis.  2. Hypertension.  3. History of renal calculi.   PAST SURGICAL HISTORY:  1. Hemorrhoidectomy.  2. Removal of renal calculi.  3. Left knee arthroscopy.  4. Excision of a melanoma.  5. Hypercholesterolemia.   MEDICATIONS:  1. Norvasc 10 mg a day.  2. Doxazosin 4 mg a day.  3. Lipitor 10 mg a day.  4. Naprosyn over-the-counter.  5. Claritin 10 mg q.d.  6. Darvocet p.r.n. pain.   ALLERGIES:  No known drug allergies.   SOCIAL HISTORY:  He is married.  Lives in a three-level home, however, can  be on one level postoperatively.  Does not smoke or drink.   FAMILY HISTORY:  Significant for mother and father with heart  disease.   REVIEW OF SYSTEMS:  The patient denies any recent fevers, chills, night  sweats.  No bleeding tendencies.  CNS:  No blurred or double vision,  seizures, strokes, or paralysis.  RESPIRATORY:  No shortness of breath,  productive cough, hemoptysis.  CARDIOVASCULAR:  No chest pain, angina,  orthopnea.  GASTROINTESTINAL:  No nausea, vomiting, constipation, melena, or  bloody stools.  GENITOURINARY:  No dysuria or hematuria.  MUSCULOSKELETAL:  As pertinent to present illness.   PHYSICAL EXAMINATION:  GENERAL:  Well-developed, well-nourished 65 year old  male.  VITAL SIGNS:  Blood pressure 144/86 on today's exam.  HEENT:  Normocephalic.  Extraocular movements intact.  NECK:  Supple.  No lymphadenopathy.  CHEST:  Clear to auscultation.  No rales.  No rhonchi.  HEART:  Regular rate and rhythm.  No murmurs, gallops, rubs, heaves, or  thrills.  ABDOMEN:  Positive bowel sounds.  Soft and nontender.  EXTREMITIES:  The left knee does have crepitus and effusion today with  painful range of motion.  It is painful along the medial and lateral joint  lines.  He has no pitting edema.  Good peripheral pulses.   IMPRESSION:  1. End-stage osteoarthritis left knee.  2. Osteoarthritis.  3. Hypertension.  4. History of renal calculi.  5. Hypercholesterolemia.   PLAN:  Left total knee arthroplasty.  The patient's primary physician is Dr.  Waynard Edwards should any medical concerns arise.     Tracy A. Shuford, P.A.-C.                 Vania Rea. Supple, M.D.    TAS/MEDQ  D:  07/08/2002  T:  07/08/2002  Job:  [id number]

## 2011-03-19 ENCOUNTER — Other Ambulatory Visit: Payer: Self-pay | Admitting: Dermatology

## 2011-04-16 ENCOUNTER — Other Ambulatory Visit: Payer: Self-pay | Admitting: Dermatology

## 2011-04-23 LAB — APTT: aPTT: 29

## 2011-04-23 LAB — CBC
HCT: 41.6
Hemoglobin: 14.2
MCHC: 34.2
MCV: 88
Platelets: 287
RBC: 4.72
RDW: 13.3
WBC: 6.9

## 2011-04-23 LAB — URINALYSIS, MICROSCOPIC ONLY
Bilirubin Urine: NEGATIVE
Glucose, UA: NEGATIVE
Hgb urine dipstick: NEGATIVE
Ketones, ur: NEGATIVE
Leukocytes, UA: NEGATIVE
Nitrite: NEGATIVE
Protein, ur: NEGATIVE
Specific Gravity, Urine: 1.023
Urobilinogen, UA: 0.2
pH: 6.5

## 2011-04-23 LAB — BASIC METABOLIC PANEL
BUN: 11
CO2: 32
Calcium: 9.8
Chloride: 101
Creatinine, Ser: 0.79
GFR calc Af Amer: >60
GFR calc non Af Amer: >60
Glucose, Bld: 98
Potassium: 3.3 — ABNORMAL LOW
Sodium: 142

## 2011-04-23 LAB — PROTIME-INR
INR: 1.2
Prothrombin Time: 15.2

## 2011-07-31 ENCOUNTER — Encounter: Payer: Self-pay | Admitting: *Deleted

## 2011-07-31 DIAGNOSIS — D239 Other benign neoplasm of skin, unspecified: Secondary | ICD-10-CM | POA: Diagnosis not present

## 2011-07-31 DIAGNOSIS — L821 Other seborrheic keratosis: Secondary | ICD-10-CM | POA: Diagnosis not present

## 2011-07-31 DIAGNOSIS — Z85820 Personal history of malignant melanoma of skin: Secondary | ICD-10-CM | POA: Diagnosis not present

## 2011-07-31 NOTE — Progress Notes (Signed)
I-PSS results 04/21/10= [id number] score=5

## 2011-08-09 DIAGNOSIS — C61 Malignant neoplasm of prostate: Secondary | ICD-10-CM | POA: Diagnosis not present

## 2011-08-16 DIAGNOSIS — C61 Malignant neoplasm of prostate: Secondary | ICD-10-CM | POA: Diagnosis not present

## 2011-10-03 ENCOUNTER — Encounter: Payer: Self-pay | Admitting: Internal Medicine

## 2011-10-16 ENCOUNTER — Encounter: Payer: Self-pay | Admitting: Internal Medicine

## 2011-11-27 ENCOUNTER — Ambulatory Visit (AMBULATORY_SURGERY_CENTER): Payer: Medicare Other

## 2011-11-27 VITALS — Ht 70.0 in | Wt 196.5 lb

## 2011-11-27 DIAGNOSIS — Z1211 Encounter for screening for malignant neoplasm of colon: Secondary | ICD-10-CM

## 2011-11-27 DIAGNOSIS — Z8719 Personal history of other diseases of the digestive system: Secondary | ICD-10-CM

## 2011-11-27 DIAGNOSIS — Z86010 Personal history of colonic polyps: Secondary | ICD-10-CM

## 2011-11-27 MED ORDER — PEG-KCL-NACL-NASULF-NA ASC-C 100 G PO SOLR: 1.0000 | Freq: Once | ORAL | Status: AC

## 2011-12-11 ENCOUNTER — Ambulatory Visit (AMBULATORY_SURGERY_CENTER): Payer: Medicare Other | Admitting: Internal Medicine

## 2011-12-11 ENCOUNTER — Encounter: Payer: Self-pay | Admitting: Internal Medicine

## 2011-12-11 VITALS — BP 110/71 | HR 92 | Temp 98.4°F | Resp 16 | Ht 70.0 in | Wt 196.0 lb

## 2011-12-11 DIAGNOSIS — Z1211 Encounter for screening for malignant neoplasm of colon: Secondary | ICD-10-CM

## 2011-12-11 DIAGNOSIS — Z86010 Personal history of colonic polyps: Secondary | ICD-10-CM

## 2011-12-11 DIAGNOSIS — Z8719 Personal history of other diseases of the digestive system: Secondary | ICD-10-CM

## 2011-12-11 DIAGNOSIS — D126 Benign neoplasm of colon, unspecified: Secondary | ICD-10-CM

## 2011-12-11 MED ORDER — SODIUM CHLORIDE 0.9 % IV SOLN: 500.0000 mL | INTRAVENOUS | Status: DC

## 2011-12-11 NOTE — Progress Notes (Signed)
Patient did not experience any of the following events: a burn prior to discharge; a fall within the facility; wrong site/side/patient/procedure/implant event; or a hospital transfer or hospital admission upon discharge from the facility. (G8907) Patient did not have preoperative order for IV antibiotic SSI prophylaxis. (G8918)  

## 2011-12-11 NOTE — Op Note (Signed)
[facility] [address] [place], [place]  [address]  COLONOSCOPY PROCEDURE REPORT  PATIENT:  Donald [name], Donald [name]  MR#:  [id number] BIRTHDATE:  1945-08-12, 66 yrs. old  GENDER:  male ENDOSCOPIST:  Hedwig Morton. Juanda Chance, MD REF. BY:  Rodrigo Ran, M.D. PROCEDURE DATE:  12/11/2011 PROCEDURE:  Colonoscopy with snare polypectomy ASA CLASS:  Class II INDICATIONS:  colorectal cancer screening, average risk last colon 2006, had polyp which was a polypoid mucosa MEDICATIONS:   MAC sedation, administered by CRNA 250 mg  DESCRIPTION OF PROCEDURE:   After the risks and benefits and of the procedure were explained, informed consent was obtained. Digital rectal exam was performed and revealed no rectal masses. The LB CF-[id number] endoscope was introduced through the anus and advanced to the ileocecal valve.  The quality of the prep was excellent, using MoviPrep.  The instrument was then slowly withdrawn as the colon was fully examined. <<PROCEDUREIMAGES>>  FINDINGS:  A sessile polyp was found (see image9). 8 mm sessile polyp at 45cm  Mild diverticulosis was found in the sigmoid colon (see image2 and image1).  This was otherwise a normal examination of the colon (see image4, image6, image7, image8, and image10). Retroflexed views in the rectum revealed no abnormalities.    The scope was then withdrawn from the patient and the procedure completed.  COMPLICATIONS:  None ENDOSCOPIC IMPRESSION: 1) Sessile polyp 2) Mild diverticulosis in the sigmoid colon 3) Otherwise normal examination RECOMMENDATIONS: 1) Await pathology results 2) High fiber diet.  REPEAT EXAM:  In 5 year(s) for.  ______________________________ Hedwig Morton. Juanda Chance, MD  CC:  n. eSIGNED:   Hedwig Morton. Alann Avey at 12/11/2011 10:33 AM  Mosetta Putt, [phone]

## 2011-12-11 NOTE — Patient Instructions (Signed)

## 2011-12-12 ENCOUNTER — Telehealth: Payer: Self-pay | Admitting: *Deleted

## 2011-12-12 NOTE — Telephone Encounter (Signed)
  Follow up Call-  Call back number 12/11/2011  Post procedure Call Back phone  # [phone]  Permission to leave phone message Yes     Patient questions:  Do you have a fever, pain , or abdominal swelling? no Pain Score  0 *  Have you tolerated food without any problems? yes  Have you been able to return to your normal activities? yes  Do you have any questions about your discharge instructions: Diet   no Medications  no Follow up visit  no  Do you have questions or concerns about your Care? no  Actions: * If pain score is 4 or above: No action needed, pain <4.

## 2011-12-18 ENCOUNTER — Encounter: Payer: Self-pay | Admitting: Internal Medicine

## 2011-12-21 DIAGNOSIS — C61 Malignant neoplasm of prostate: Secondary | ICD-10-CM | POA: Diagnosis not present

## 2011-12-28 DIAGNOSIS — C61 Malignant neoplasm of prostate: Secondary | ICD-10-CM | POA: Diagnosis not present

## 2011-12-28 DIAGNOSIS — M19049 Primary osteoarthritis, unspecified hand: Secondary | ICD-10-CM | POA: Diagnosis not present

## 2012-02-26 DIAGNOSIS — D239 Other benign neoplasm of skin, unspecified: Secondary | ICD-10-CM | POA: Diagnosis not present

## 2012-02-26 DIAGNOSIS — L821 Other seborrheic keratosis: Secondary | ICD-10-CM | POA: Diagnosis not present

## 2012-02-26 DIAGNOSIS — Z85820 Personal history of malignant melanoma of skin: Secondary | ICD-10-CM | POA: Diagnosis not present

## 2012-04-22 DIAGNOSIS — C61 Malignant neoplasm of prostate: Secondary | ICD-10-CM | POA: Diagnosis not present

## 2012-04-28 DIAGNOSIS — M19049 Primary osteoarthritis, unspecified hand: Secondary | ICD-10-CM | POA: Diagnosis not present

## 2012-04-29 DIAGNOSIS — C61 Malignant neoplasm of prostate: Secondary | ICD-10-CM | POA: Diagnosis not present

## 2012-06-30 DIAGNOSIS — M19049 Primary osteoarthritis, unspecified hand: Secondary | ICD-10-CM | POA: Diagnosis not present

## 2012-07-01 DIAGNOSIS — R7301 Impaired fasting glucose: Secondary | ICD-10-CM | POA: Diagnosis not present

## 2012-07-01 DIAGNOSIS — Z125 Encounter for screening for malignant neoplasm of prostate: Secondary | ICD-10-CM | POA: Diagnosis not present

## 2012-07-01 DIAGNOSIS — E7800 Pure hypercholesterolemia, unspecified: Secondary | ICD-10-CM | POA: Diagnosis not present

## 2012-07-01 DIAGNOSIS — I10 Essential (primary) hypertension: Secondary | ICD-10-CM | POA: Diagnosis not present

## 2012-07-08 DIAGNOSIS — Z125 Encounter for screening for malignant neoplasm of prostate: Secondary | ICD-10-CM | POA: Diagnosis not present

## 2012-07-08 DIAGNOSIS — Z0000 Encounter for general adult medical examination without abnormal findings: Secondary | ICD-10-CM | POA: Diagnosis not present

## 2012-07-08 DIAGNOSIS — Z1331 Encounter for screening for depression: Secondary | ICD-10-CM | POA: Diagnosis not present

## 2012-07-08 DIAGNOSIS — I10 Essential (primary) hypertension: Secondary | ICD-10-CM | POA: Diagnosis not present

## 2012-07-08 DIAGNOSIS — E876 Hypokalemia: Secondary | ICD-10-CM | POA: Diagnosis not present

## 2012-07-10 DIAGNOSIS — Z1212 Encounter for screening for malignant neoplasm of rectum: Secondary | ICD-10-CM | POA: Diagnosis not present

## 2012-10-23 DIAGNOSIS — C61 Malignant neoplasm of prostate: Secondary | ICD-10-CM | POA: Diagnosis not present

## 2012-10-30 DIAGNOSIS — C61 Malignant neoplasm of prostate: Secondary | ICD-10-CM | POA: Diagnosis not present

## 2012-11-19 DIAGNOSIS — E876 Hypokalemia: Secondary | ICD-10-CM | POA: Diagnosis not present

## 2013-02-25 DIAGNOSIS — D2370 Other benign neoplasm of skin of unspecified lower limb, including hip: Secondary | ICD-10-CM | POA: Diagnosis not present

## 2013-02-25 DIAGNOSIS — D239 Other benign neoplasm of skin, unspecified: Secondary | ICD-10-CM | POA: Diagnosis not present

## 2013-02-25 DIAGNOSIS — L821 Other seborrheic keratosis: Secondary | ICD-10-CM | POA: Diagnosis not present

## 2013-02-25 DIAGNOSIS — D2320 Other benign neoplasm of skin of unspecified ear and external auricular canal: Secondary | ICD-10-CM | POA: Diagnosis not present

## 2013-02-25 DIAGNOSIS — Z85820 Personal history of malignant melanoma of skin: Secondary | ICD-10-CM | POA: Diagnosis not present

## 2013-03-09 DIAGNOSIS — M19049 Primary osteoarthritis, unspecified hand: Secondary | ICD-10-CM | POA: Diagnosis not present

## 2013-05-08 DIAGNOSIS — C61 Malignant neoplasm of prostate: Secondary | ICD-10-CM | POA: Diagnosis not present

## 2013-05-14 DIAGNOSIS — C61 Malignant neoplasm of prostate: Secondary | ICD-10-CM | POA: Diagnosis not present

## 2013-05-22 DIAGNOSIS — Z23 Encounter for immunization: Secondary | ICD-10-CM | POA: Diagnosis not present

## 2013-07-13 DIAGNOSIS — M19049 Primary osteoarthritis, unspecified hand: Secondary | ICD-10-CM | POA: Diagnosis not present

## 2013-07-22 DIAGNOSIS — G8929 Other chronic pain: Secondary | ICD-10-CM | POA: Diagnosis not present

## 2013-07-22 DIAGNOSIS — Z96659 Presence of unspecified artificial knee joint: Secondary | ICD-10-CM | POA: Diagnosis not present

## 2013-08-18 DIAGNOSIS — I10 Essential (primary) hypertension: Secondary | ICD-10-CM | POA: Diagnosis not present

## 2013-08-18 DIAGNOSIS — E7800 Pure hypercholesterolemia, unspecified: Secondary | ICD-10-CM | POA: Diagnosis not present

## 2013-08-18 DIAGNOSIS — R7301 Impaired fasting glucose: Secondary | ICD-10-CM | POA: Diagnosis not present

## 2013-08-18 DIAGNOSIS — Z125 Encounter for screening for malignant neoplasm of prostate: Secondary | ICD-10-CM | POA: Diagnosis not present

## 2013-08-21 DIAGNOSIS — E7800 Pure hypercholesterolemia, unspecified: Secondary | ICD-10-CM | POA: Diagnosis not present

## 2013-08-24 DIAGNOSIS — Z8546 Personal history of malignant neoplasm of prostate: Secondary | ICD-10-CM | POA: Diagnosis not present

## 2013-08-24 DIAGNOSIS — N529 Male erectile dysfunction, unspecified: Secondary | ICD-10-CM | POA: Diagnosis not present

## 2013-08-24 DIAGNOSIS — F3289 Other specified depressive episodes: Secondary | ICD-10-CM | POA: Diagnosis not present

## 2013-08-24 DIAGNOSIS — E7800 Pure hypercholesterolemia, unspecified: Secondary | ICD-10-CM | POA: Diagnosis not present

## 2013-08-24 DIAGNOSIS — Z23 Encounter for immunization: Secondary | ICD-10-CM | POA: Diagnosis not present

## 2013-08-24 DIAGNOSIS — Z1331 Encounter for screening for depression: Secondary | ICD-10-CM | POA: Diagnosis not present

## 2013-08-24 DIAGNOSIS — R7301 Impaired fasting glucose: Secondary | ICD-10-CM | POA: Diagnosis not present

## 2013-08-24 DIAGNOSIS — Z1212 Encounter for screening for malignant neoplasm of rectum: Secondary | ICD-10-CM | POA: Diagnosis not present

## 2013-08-24 DIAGNOSIS — Z0000 Encounter for general adult medical examination without abnormal findings: Secondary | ICD-10-CM | POA: Diagnosis not present

## 2013-08-24 DIAGNOSIS — M25569 Pain in unspecified knee: Secondary | ICD-10-CM | POA: Diagnosis not present

## 2013-09-21 ENCOUNTER — Encounter: Payer: Self-pay | Admitting: *Deleted

## 2013-10-21 ENCOUNTER — Encounter: Payer: Self-pay | Admitting: Internal Medicine

## 2013-10-21 ENCOUNTER — Ambulatory Visit (INDEPENDENT_AMBULATORY_CARE_PROVIDER_SITE_OTHER): Payer: Medicare Other | Admitting: Internal Medicine

## 2013-10-21 VITALS — BP 100/60 | HR 108 | Ht 68.5 in | Wt 201.4 lb

## 2013-10-21 DIAGNOSIS — K648 Other hemorrhoids: Secondary | ICD-10-CM | POA: Diagnosis not present

## 2013-10-21 DIAGNOSIS — R195 Other fecal abnormalities: Secondary | ICD-10-CM | POA: Diagnosis not present

## 2013-10-21 NOTE — Patient Instructions (Addendum)
You have been given a separate informational sheet regarding your tobacco use, the importance of quitting and local resources to help you quit.  Please complete the suppositories given to you by Dr Joylene Draft.  Once you have completed the suppositories, you should complete stool cards (please read instructions on kit) and return them via USPS to our lab.  CC:Dr [employer]

## 2013-10-21 NOTE — Progress Notes (Signed)
Donald [name] Mar 04, 1946 [id number]  Note: This dictation was prepared with Dragon digital system. Any transcriptional errors that result from this procedure are unintentional.   History of Present Illness:  This is a 68 year old white male who come for evaluation after he had home Hemoccult cards positive for blood. He has a normal hemoglobin. He occasionally sees bright blood on his toilet tissue. He had prostate cancer and radioactive seed placement. A prior colonoscopy in 2006 showed a colon polyp which was polypoid mucosa on the pathology report. His most recent colonoscopy in May 2013 showed a tubular adenoma. He denies hematemesis or abdominal pain. He also denies excessive use of alcohol or anti-inflammatory agents. There is no family history of colon cancer. He has started to use an anti-hemorrhoidal preparation given to him from Dr. Joylene Draft.    Past Medical History  Diagnosis Date  . Hyperlipidemia   . Hypertension   . Arthritis   . Depression   . Prostate cancer     seed implant  . Tubular adenoma of colon   . Diverticulosis     Past Surgical History  Procedure Laterality Date  . Total knee arthroplasty Left     left knee in 2003 and 2007  . Hemorrhoid surgery    . Prostate surgery      seed implant  . Melanoma excision      scapula,face, and right calf    No Known Allergies  Family history and social history have been reviewed.  Review of Systems: Denies heartburn dysphagia weight loss  The remainder of the 10 point ROS is negative except as outlined in the H&P  Physical Exam: General Appearance Well developed, in no distress Eyes  Non icteric  HEENT  Non traumatic, normocephalic  Mouth No lesion, tongue papillated, no cheilosis Neck Supple without adenopathy, thyroid not enlarged, no carotid bruits, no JVD Lungs Clear to auscultation bilaterally COR Normal S1, normal S2, regular rhythm, no murmur, quiet precordium Abdomen soft nontender abdomen with  normoactive bowel sounds. Liver edge at costal margin Rectal and anoscopic exam shows a normal perianal area. Normal rectal sphincter tone. Soft Hemoccult negative stool in the ampulla. The first grade internal hemorrhoids x3. They are Hyperemic but there is no active bleeding. There is no evidence of radiation proctitis Extremities  No pedal edema Skin No lesions Neurological Alert and oriented x 3 Psychological Normal mood and affect  Assessment and Plan:   Problem #37 68 year old white male who is up-to-date on colonoscopy and has a history of a tubular adenoma in May 2013. He has first grade internal hemorrhoids on anoscopic exam today and he personally prefers to be treated for his hemorrhoids first and repeat Hemoccult cards before deciding about further diagnosic workup. I agree with his suggestions. If his Hemoccult cards are positive, we will either proceed with a colonoscopy or Cologuard stool test. He will use a hydrocortisone preparation from Dr. Joylene Draft to use for the next 12-14 days and afterwards will do Hemoccult cards.    Donald [name] 10/21/2013

## 2013-10-22 ENCOUNTER — Encounter: Payer: Self-pay | Admitting: Internal Medicine

## 2013-11-16 DIAGNOSIS — C61 Malignant neoplasm of prostate: Secondary | ICD-10-CM | POA: Diagnosis not present

## 2013-11-20 DIAGNOSIS — C61 Malignant neoplasm of prostate: Secondary | ICD-10-CM | POA: Diagnosis not present

## 2014-03-04 DIAGNOSIS — D239 Other benign neoplasm of skin, unspecified: Secondary | ICD-10-CM | POA: Diagnosis not present

## 2014-03-04 DIAGNOSIS — L739 Follicular disorder, unspecified: Secondary | ICD-10-CM | POA: Diagnosis not present

## 2014-03-04 DIAGNOSIS — L821 Other seborrheic keratosis: Secondary | ICD-10-CM | POA: Diagnosis not present

## 2014-03-04 DIAGNOSIS — Z85820 Personal history of malignant melanoma of skin: Secondary | ICD-10-CM | POA: Diagnosis not present

## 2014-03-04 DIAGNOSIS — L819 Disorder of pigmentation, unspecified: Secondary | ICD-10-CM | POA: Diagnosis not present

## 2014-04-21 DIAGNOSIS — Z23 Encounter for immunization: Secondary | ICD-10-CM | POA: Diagnosis not present

## 2014-04-22 ENCOUNTER — Encounter: Payer: Self-pay | Admitting: Internal Medicine

## 2014-05-14 ENCOUNTER — Other Ambulatory Visit: Payer: Self-pay

## 2014-05-14 DIAGNOSIS — L821 Other seborrheic keratosis: Secondary | ICD-10-CM | POA: Diagnosis not present

## 2014-05-14 DIAGNOSIS — D485 Neoplasm of uncertain behavior of skin: Secondary | ICD-10-CM | POA: Diagnosis not present

## 2014-05-14 DIAGNOSIS — L820 Inflamed seborrheic keratosis: Secondary | ICD-10-CM | POA: Diagnosis not present

## 2014-05-18 DIAGNOSIS — C61 Malignant neoplasm of prostate: Secondary | ICD-10-CM | POA: Diagnosis not present

## 2014-05-25 DIAGNOSIS — N5201 Erectile dysfunction due to arterial insufficiency: Secondary | ICD-10-CM | POA: Diagnosis not present

## 2014-05-25 DIAGNOSIS — Z8546 Personal history of malignant neoplasm of prostate: Secondary | ICD-10-CM | POA: Diagnosis not present

## 2014-08-27 DIAGNOSIS — Z008 Encounter for other general examination: Secondary | ICD-10-CM | POA: Diagnosis not present

## 2014-08-27 DIAGNOSIS — I10 Essential (primary) hypertension: Secondary | ICD-10-CM | POA: Diagnosis not present

## 2014-08-27 DIAGNOSIS — E780 Pure hypercholesterolemia: Secondary | ICD-10-CM | POA: Diagnosis not present

## 2014-08-27 DIAGNOSIS — R7301 Impaired fasting glucose: Secondary | ICD-10-CM | POA: Diagnosis not present

## 2014-08-27 DIAGNOSIS — Z0000 Encounter for general adult medical examination without abnormal findings: Secondary | ICD-10-CM | POA: Diagnosis not present

## 2014-08-27 DIAGNOSIS — C61 Malignant neoplasm of prostate: Secondary | ICD-10-CM | POA: Diagnosis not present

## 2014-08-27 DIAGNOSIS — Z125 Encounter for screening for malignant neoplasm of prostate: Secondary | ICD-10-CM | POA: Diagnosis not present

## 2014-09-03 DIAGNOSIS — C61 Malignant neoplasm of prostate: Secondary | ICD-10-CM | POA: Diagnosis not present

## 2014-09-03 DIAGNOSIS — C439 Malignant melanoma of skin, unspecified: Secondary | ICD-10-CM | POA: Diagnosis not present

## 2014-09-03 DIAGNOSIS — E780 Pure hypercholesterolemia: Secondary | ICD-10-CM | POA: Diagnosis not present

## 2014-09-03 DIAGNOSIS — R7301 Impaired fasting glucose: Secondary | ICD-10-CM | POA: Diagnosis not present

## 2014-09-03 DIAGNOSIS — M25569 Pain in unspecified knee: Secondary | ICD-10-CM | POA: Diagnosis not present

## 2014-09-03 DIAGNOSIS — N529 Male erectile dysfunction, unspecified: Secondary | ICD-10-CM | POA: Diagnosis not present

## 2014-09-03 DIAGNOSIS — Z1389 Encounter for screening for other disorder: Secondary | ICD-10-CM | POA: Diagnosis not present

## 2014-09-03 DIAGNOSIS — I10 Essential (primary) hypertension: Secondary | ICD-10-CM | POA: Diagnosis not present

## 2014-09-03 DIAGNOSIS — F329 Major depressive disorder, single episode, unspecified: Secondary | ICD-10-CM | POA: Diagnosis not present

## 2014-09-03 DIAGNOSIS — Z0000 Encounter for general adult medical examination without abnormal findings: Secondary | ICD-10-CM | POA: Diagnosis not present

## 2014-09-03 DIAGNOSIS — M1990 Unspecified osteoarthritis, unspecified site: Secondary | ICD-10-CM | POA: Diagnosis not present

## 2014-09-03 DIAGNOSIS — Z6829 Body mass index (BMI) 29.0-29.9, adult: Secondary | ICD-10-CM | POA: Diagnosis not present

## 2014-09-08 DIAGNOSIS — M4310 Spondylolisthesis, site unspecified: Secondary | ICD-10-CM | POA: Diagnosis not present

## 2014-09-11 ENCOUNTER — Ambulatory Visit (INDEPENDENT_AMBULATORY_CARE_PROVIDER_SITE_OTHER): Payer: Medicare Other | Admitting: Family Medicine

## 2014-09-11 VITALS — BP 118/60 | HR 111 | Temp 97.7°F | Resp 18 | Ht 69.25 in | Wt 206.6 lb

## 2014-09-11 DIAGNOSIS — S40021A Contusion of right upper arm, initial encounter: Secondary | ICD-10-CM

## 2014-09-11 DIAGNOSIS — R109 Unspecified abdominal pain: Secondary | ICD-10-CM

## 2014-09-11 DIAGNOSIS — M545 Low back pain, unspecified: Secondary | ICD-10-CM

## 2014-09-11 DIAGNOSIS — S300XXA Contusion of lower back and pelvis, initial encounter: Secondary | ICD-10-CM | POA: Diagnosis not present

## 2014-09-11 DIAGNOSIS — M62830 Muscle spasm of back: Secondary | ICD-10-CM | POA: Diagnosis not present

## 2014-09-11 LAB — POCT UA - MICROSCOPIC ONLY
Bacteria, U Microscopic: NEGATIVE
Casts, Ur, LPF, POC: NEGATIVE
Crystals, Ur, HPF, POC: NEGATIVE
EPITHELIAL CELLS, URINE PER MICROSCOPY: NEGATIVE
Mucus, UA: NEGATIVE
RBC, urine, microscopic: NEGATIVE
Yeast, UA: NEGATIVE

## 2014-09-11 LAB — POCT URINALYSIS DIPSTICK
Bilirubin, UA: NEGATIVE
Blood, UA: NEGATIVE
GLUCOSE UA: NEGATIVE
Leukocytes, UA: NEGATIVE
NITRITE UA: NEGATIVE
PH UA: 7
Protein, UA: NEGATIVE
Spec Grav, UA: 1.015
Urobilinogen, UA: 0.2

## 2014-09-11 MED ORDER — CYCLOBENZAPRINE HCL 5 MG PO TABS: ORAL_TABLET | ORAL | Status: DC

## 2014-09-11 NOTE — Patient Instructions (Addendum)
Heat, ice, or combination of the two can help with pain and muscle spasm. Flexeril if needed (avoid combining with Ambien), gentle range of motion.    Return to the clinic or go to the nearest emergency room if any of your symptoms worsen or new symptoms occur. Contusion A contusion is a deep bruise. Contusions are the result of an injury that caused bleeding under the skin. The contusion may turn blue, purple, or yellow. Minor injuries will give you a painless contusion, but more severe contusions may stay painful and swollen for a few weeks.  CAUSES  A contusion is usually caused by a blow, trauma, or direct force to an area of the body. SYMPTOMS   Swelling and redness of the injured area.  Bruising of the injured area.  Tenderness and soreness of the injured area.  Pain. DIAGNOSIS  The diagnosis can be made by taking a history and physical exam. An X-ray, CT scan, or MRI may be needed to determine if there were any associated injuries, such as fractures. TREATMENT  Specific treatment will depend on what area of the body was injured. In general, the best treatment for a contusion is resting, icing, elevating, and applying cold compresses to the injured area. Over-the-counter medicines may also be recommended for pain control. Ask your caregiver what the best treatment is for your contusion. HOME CARE INSTRUCTIONS   Put ice on the injured area.  Put ice in a plastic bag.  Place a towel between your skin and the bag.  Leave the ice on for 15-20 minutes, 3-4 times a day, or as directed by your health care provider.  Only take over-the-counter or prescription medicines for pain, discomfort, or fever as directed by your caregiver. Your caregiver may recommend avoiding anti-inflammatory medicines (aspirin, ibuprofen, and naproxen) for 48 hours because these medicines may increase bruising.  Rest the injured area.  If possible, elevate the injured area to reduce swelling. SEEK IMMEDIATE  MEDICAL CARE IF:   You have increased bruising or swelling.  You have pain that is getting worse.  Your swelling or pain is not relieved with medicines. MAKE SURE YOU:   Understand these instructions.  Will watch your condition.  Will get help right away if you are not doing well or get worse. Document Released: 04/18/2005 Document Revised: 07/14/2013 Document Reviewed: 05/14/2011 [facility] Patient Information 2015 [place], [place]. This information is not intended to replace advice given to you by your health care provider. Make sure you discuss any questions you have with your health care provider.

## 2014-09-11 NOTE — Progress Notes (Addendum)
Subjective:  This chart was scribe for Donald Ray, MD by Judithann Sauger, ED Scribe. The patient was seen in Room 1 and the patient's care was started at 3:32 PM.    Patient ID: Donald [name], male    DOB: April 06, 1946, 69 y.o.   MRN: [id number]  HPI HPI Comments: Donald [name] is a 69 y.o. male who presents to the Emergency Department status post fall that occurred 5 days ago. He reports that he slipped on an icy step and his bottom right flank hit the side of the brick. He was then seen by his family doctor, Dr. Joylene [name] 3 days ago and he was told over the phone that the pain will resolve in a couple of weeks. He reports associated bottom right flank pain that radiates down his right leg. He denies saddle anesthesia, weakness in the legs, loss of bladder control, and hematuria. He reports just using an ace bandage and taking 3 daily Vicodin which he takes for his knee, thumb, and shoulder arthiritis. He adds that he took 3 Vicodin 2 days ago but has since resumed to his normal dosage. He denies that the pain is getting worse. He had a negative xray.   There are no active problems to display for this patient.  Past Medical History  Diagnosis Date  . Hyperlipidemia   . Hypertension   . Arthritis   . Depression   . Prostate cancer     seed implant  . Tubular adenoma of colon   . Diverticulosis    Past Surgical History  Procedure Laterality Date  . Total knee arthroplasty Left     left knee in 2003 and 2007  . Hemorrhoid surgery    . Prostate surgery      seed implant  . Melanoma excision      scapula,face, and right calf  . Eye surgery    . Joint replacement     No Known Allergies Prior to Admission medications   Medication Sig Start Date End Date Taking? Authorizing Provider  amLODipine (NORVASC) 10 MG tablet Take 10 mg by mouth daily.   Yes Historical Provider, MD  atorvastatin (LIPITOR) 10 MG tablet Take 10 mg by mouth daily.   Yes Historical Provider, MD  benazepril  (LOTENSIN) 20 MG tablet Take 20 mg by mouth daily.   Yes Historical Provider, MD  potassium chloride (K-DUR) 10 MEQ tablet Take 10 mEq by mouth 2 (two) times daily.   Yes Historical Provider, MD  Tamsulosin HCl (FLOMAX) 0.4 MG CAPS Take by mouth 2 (two) times daily.   Yes Historical Provider, MD  venlafaxine XR (EFFEXOR-XR) 150 MG 24 hr capsule Take 150 mg by mouth daily.   Yes Historical Provider, MD  zolpidem (AMBIEN) 10 MG tablet Take 10 mg by mouth at bedtime as needed.   Yes Historical Provider, MD  HYDROcodone-acetaminophen (NORCO) 7.5-325 MG per tablet Take 1-2 tablets by mouth 3 (three) times daily.  10/05/13   Historical Provider, MD     Review of Systems  Cardiovascular: Negative for leg swelling.  Genitourinary: Positive for flank pain. Negative for dysuria, urgency and hematuria.  Musculoskeletal: Positive for back pain and arthralgias (left leg).  All other systems reviewed and are negative.      Objective:   Physical Exam  Constitutional: He is oriented to person, place, and time. He appears well-developed and well-nourished.  HENT:  Head: Normocephalic and atraumatic.  Eyes: EOM are normal. Pupils are equal, round, and reactive to  light.  Neck: No JVD present. Carotid bruit is not present.  Cardiovascular: Normal rate, regular rhythm and normal heart sounds.   No murmur heard. Pulmonary/Chest: Effort normal and breath sounds normal. He has no rales.  Abdominal: Soft. He exhibits no distension. There is no tenderness.  Negative Cullen's sign.    Musculoskeletal: He exhibits tenderness. He exhibits no edema.  He is able to heel and toe walk without difficulty.  Flexion to 90 degrees.  Pain in the right lower back with left flexion and left rotation.  No midline bony tenderness.  Right lumbar spine: L4-L5 paraspinal muscles, there is spasm and indurated area about 5 by 3 cm with surrounding ecchymosis.  SI joint and sacroiliac joints non tender.  1+ patella reflex on  right, unable to feel on left (hx of TKR) 1+ achilles reflexes bilaterally Negative straight leg raise.  Right elbow ROM, no bony tenderness, no radial tenderness Resulting ecchymosis over ulvar aspect of arm.  Neurological: He is alert and oriented to person, place, and time.  Skin: Skin is warm and dry.  Psychiatric: He has a normal mood and affect.  Vitals reviewed.   Filed Vitals:   09/11/14 1506  BP: 118/60  Pulse: 111  Temp: 97.7 F (36.5 C)  TempSrc: Oral  Resp: 18  Height: 5' 9.25" (1.759 m)  Weight: 206 lb 9.6 oz (93.713 kg)  SpO2: 95%    Results for orders placed or performed in visit on 09/11/14  POCT UA - Microscopic Only  Result Value Ref Range   WBC, Ur, HPF, POC 0-1    RBC, urine, microscopic neg    Bacteria, U Microscopic neg    Mucus, UA neg    Epithelial cells, urine per micros neg    Crystals, Ur, HPF, POC neg    Casts, Ur, LPF, POC neg    Yeast, UA neg   POCT urinalysis dipstick  Result Value Ref Range   Color, UA yellow    Clarity, UA clear    Glucose, UA neg    Bilirubin, UA neg    Ketones, UA trace    Spec Grav, UA 1.015    Blood, UA neg    pH, UA 7.0    Protein, UA neg    Urobilinogen, UA 0.2    Nitrite, UA neg    Leukocytes, UA Negative        Assessment & Plan:   Donald [name] is a 69 y.o. male Right flank pain - Plan: POCT UA - Microscopic Only, POCT urinalysis dipstick  Right-sided low back pain without sciatica - Plan: POCT UA - Microscopic Only, POCT urinalysis dipstick  Contusion of lower back, initial encounter - Plan: POCT UA - Microscopic Only, POCT urinalysis dipstick, cyclobenzaprine (FLEXERIL) 5 MG tablet  Superficial bruising of arm, right, initial encounter  Spasm of back muscles - Plan: cyclobenzaprine (FLEXERIL) 5 MG tablet  Suspected lumbar contusion, no hematuria noted. No radicular sx's with exam.  Reassuring exam.  Continued sx care discussed, has hydrocodone already, rx given for flexeril - SED and caution  with other sedating meds. rtc precautions discussed.   Meds ordered this encounter  Medications  . cyclobenzaprine (FLEXERIL) 5 MG tablet    Sig: 1 pill by mouth up to every 8 hours as needed. Start with one pill by mouth each bedtime as needed due to sedation    Dispense:  15 tablet    Refill:  0   Patient Instructions  Heat, ice, or combination  of the two can help with pain and muscle spasm. Flexeril if needed (avoid combining with Ambien), gentle range of motion.    Return to the clinic or go to the nearest emergency room if any of your symptoms worsen or new symptoms occur. Contusion A contusion is a deep bruise. Contusions are the result of an injury that caused bleeding under the skin. The contusion may turn blue, purple, or yellow. Minor injuries will give you a painless contusion, but more severe contusions may stay painful and swollen for a few weeks.  CAUSES  A contusion is usually caused by a blow, trauma, or direct force to an area of the body. SYMPTOMS   Swelling and redness of the injured area.  Bruising of the injured area.  Tenderness and soreness of the injured area.  Pain. DIAGNOSIS  The diagnosis can be made by taking a history and physical exam. An X-[name], CT scan, or MRI may be needed to determine if there were any associated injuries, such as fractures. TREATMENT  Specific treatment will depend on what area of the body was injured. In general, the best treatment for a contusion is resting, icing, elevating, and applying cold compresses to the injured area. Over-the-counter medicines may also be recommended for pain control. Ask your caregiver what the best treatment is for your contusion. HOME CARE INSTRUCTIONS   Put ice on the injured area.  Put ice in a plastic bag.  Place a towel between your skin and the bag.  Leave the ice on for 15-20 minutes, 3-4 times a day, or as directed by your health care provider.  Only take over-the-counter or prescription  medicines for pain, discomfort, or fever as directed by your caregiver. Your caregiver may recommend avoiding anti-inflammatory medicines (aspirin, ibuprofen, and naproxen) for 48 hours because these medicines may increase bruising.  Rest the injured area.  If possible, elevate the injured area to reduce swelling. SEEK IMMEDIATE MEDICAL CARE IF:   You have increased bruising or swelling.  You have pain that is getting worse.  Your swelling or pain is not relieved with medicines. MAKE SURE YOU:   Understand these instructions.  Will watch your condition.  Will get help right away if you are not doing well or get worse. Document Released: 04/18/2005 Document Revised: 07/14/2013 Document Reviewed: 05/14/2011 [facility] Patient Information 2015 [place], [place]. This information is not intended to replace advice given to you by your health care provider. Make sure you discuss any questions you have with your health care provider.       I personally performed the services described in this documentation, which was scribed in my presence. The recorded information has been reviewed and considered, and addended by me as needed.

## 2014-11-16 DIAGNOSIS — M180 Bilateral primary osteoarthritis of first carpometacarpal joints: Secondary | ICD-10-CM | POA: Diagnosis not present

## 2014-11-26 DIAGNOSIS — Z8546 Personal history of malignant neoplasm of prostate: Secondary | ICD-10-CM | POA: Diagnosis not present

## 2014-12-03 DIAGNOSIS — Z8546 Personal history of malignant neoplasm of prostate: Secondary | ICD-10-CM | POA: Diagnosis not present

## 2014-12-13 DIAGNOSIS — M1711 Unilateral primary osteoarthritis, right knee: Secondary | ICD-10-CM | POA: Diagnosis not present

## 2014-12-16 DIAGNOSIS — M1711 Unilateral primary osteoarthritis, right knee: Secondary | ICD-10-CM | POA: Diagnosis not present

## 2014-12-18 DIAGNOSIS — M1711 Unilateral primary osteoarthritis, right knee: Secondary | ICD-10-CM | POA: Diagnosis not present

## 2014-12-24 DIAGNOSIS — M1711 Unilateral primary osteoarthritis, right knee: Secondary | ICD-10-CM | POA: Diagnosis not present

## 2015-02-01 DIAGNOSIS — Z6829 Body mass index (BMI) 29.0-29.9, adult: Secondary | ICD-10-CM | POA: Diagnosis not present

## 2015-02-01 DIAGNOSIS — M542 Cervicalgia: Secondary | ICD-10-CM | POA: Diagnosis not present

## 2015-02-21 DIAGNOSIS — M5032 Other cervical disc degeneration, mid-cervical region: Secondary | ICD-10-CM | POA: Diagnosis not present

## 2015-02-21 DIAGNOSIS — M542 Cervicalgia: Secondary | ICD-10-CM | POA: Diagnosis not present

## 2015-02-21 DIAGNOSIS — I10 Essential (primary) hypertension: Secondary | ICD-10-CM | POA: Diagnosis not present

## 2015-02-21 DIAGNOSIS — M1990 Unspecified osteoarthritis, unspecified site: Secondary | ICD-10-CM | POA: Diagnosis not present

## 2015-02-21 DIAGNOSIS — Z6829 Body mass index (BMI) 29.0-29.9, adult: Secondary | ICD-10-CM | POA: Diagnosis not present

## 2015-02-23 DIAGNOSIS — M4003 Postural kyphosis, cervicothoracic region: Secondary | ICD-10-CM | POA: Diagnosis not present

## 2015-02-23 DIAGNOSIS — M5384 Other specified dorsopathies, thoracic region: Secondary | ICD-10-CM | POA: Diagnosis not present

## 2015-02-23 DIAGNOSIS — M9902 Segmental and somatic dysfunction of thoracic region: Secondary | ICD-10-CM | POA: Diagnosis not present

## 2015-02-23 DIAGNOSIS — M5033 Other cervical disc degeneration, cervicothoracic region: Secondary | ICD-10-CM | POA: Diagnosis not present

## 2015-02-23 DIAGNOSIS — M791 Myalgia: Secondary | ICD-10-CM | POA: Diagnosis not present

## 2015-02-23 DIAGNOSIS — M9901 Segmental and somatic dysfunction of cervical region: Secondary | ICD-10-CM | POA: Diagnosis not present

## 2015-02-25 DIAGNOSIS — M5384 Other specified dorsopathies, thoracic region: Secondary | ICD-10-CM | POA: Diagnosis not present

## 2015-02-25 DIAGNOSIS — M5033 Other cervical disc degeneration, cervicothoracic region: Secondary | ICD-10-CM | POA: Diagnosis not present

## 2015-02-25 DIAGNOSIS — M4003 Postural kyphosis, cervicothoracic region: Secondary | ICD-10-CM | POA: Diagnosis not present

## 2015-02-25 DIAGNOSIS — M9901 Segmental and somatic dysfunction of cervical region: Secondary | ICD-10-CM | POA: Diagnosis not present

## 2015-02-25 DIAGNOSIS — M791 Myalgia: Secondary | ICD-10-CM | POA: Diagnosis not present

## 2015-02-25 DIAGNOSIS — M9902 Segmental and somatic dysfunction of thoracic region: Secondary | ICD-10-CM | POA: Diagnosis not present

## 2015-02-28 DIAGNOSIS — M5033 Other cervical disc degeneration, cervicothoracic region: Secondary | ICD-10-CM | POA: Diagnosis not present

## 2015-02-28 DIAGNOSIS — M5384 Other specified dorsopathies, thoracic region: Secondary | ICD-10-CM | POA: Diagnosis not present

## 2015-02-28 DIAGNOSIS — M4003 Postural kyphosis, cervicothoracic region: Secondary | ICD-10-CM | POA: Diagnosis not present

## 2015-02-28 DIAGNOSIS — M9901 Segmental and somatic dysfunction of cervical region: Secondary | ICD-10-CM | POA: Diagnosis not present

## 2015-02-28 DIAGNOSIS — M9902 Segmental and somatic dysfunction of thoracic region: Secondary | ICD-10-CM | POA: Diagnosis not present

## 2015-02-28 DIAGNOSIS — M791 Myalgia: Secondary | ICD-10-CM | POA: Diagnosis not present

## 2015-03-02 DIAGNOSIS — M5033 Other cervical disc degeneration, cervicothoracic region: Secondary | ICD-10-CM | POA: Diagnosis not present

## 2015-03-02 DIAGNOSIS — M5384 Other specified dorsopathies, thoracic region: Secondary | ICD-10-CM | POA: Diagnosis not present

## 2015-03-02 DIAGNOSIS — M9902 Segmental and somatic dysfunction of thoracic region: Secondary | ICD-10-CM | POA: Diagnosis not present

## 2015-03-02 DIAGNOSIS — M791 Myalgia: Secondary | ICD-10-CM | POA: Diagnosis not present

## 2015-03-02 DIAGNOSIS — M9901 Segmental and somatic dysfunction of cervical region: Secondary | ICD-10-CM | POA: Diagnosis not present

## 2015-03-02 DIAGNOSIS — M4003 Postural kyphosis, cervicothoracic region: Secondary | ICD-10-CM | POA: Diagnosis not present

## 2015-03-03 DIAGNOSIS — M4003 Postural kyphosis, cervicothoracic region: Secondary | ICD-10-CM | POA: Diagnosis not present

## 2015-03-03 DIAGNOSIS — M9901 Segmental and somatic dysfunction of cervical region: Secondary | ICD-10-CM | POA: Diagnosis not present

## 2015-03-03 DIAGNOSIS — M791 Myalgia: Secondary | ICD-10-CM | POA: Diagnosis not present

## 2015-03-03 DIAGNOSIS — M5033 Other cervical disc degeneration, cervicothoracic region: Secondary | ICD-10-CM | POA: Diagnosis not present

## 2015-03-03 DIAGNOSIS — M5384 Other specified dorsopathies, thoracic region: Secondary | ICD-10-CM | POA: Diagnosis not present

## 2015-03-03 DIAGNOSIS — M9902 Segmental and somatic dysfunction of thoracic region: Secondary | ICD-10-CM | POA: Diagnosis not present

## 2015-03-07 DIAGNOSIS — M5384 Other specified dorsopathies, thoracic region: Secondary | ICD-10-CM | POA: Diagnosis not present

## 2015-03-07 DIAGNOSIS — M5033 Other cervical disc degeneration, cervicothoracic region: Secondary | ICD-10-CM | POA: Diagnosis not present

## 2015-03-07 DIAGNOSIS — M9902 Segmental and somatic dysfunction of thoracic region: Secondary | ICD-10-CM | POA: Diagnosis not present

## 2015-03-07 DIAGNOSIS — M791 Myalgia: Secondary | ICD-10-CM | POA: Diagnosis not present

## 2015-03-07 DIAGNOSIS — M4003 Postural kyphosis, cervicothoracic region: Secondary | ICD-10-CM | POA: Diagnosis not present

## 2015-03-07 DIAGNOSIS — M9901 Segmental and somatic dysfunction of cervical region: Secondary | ICD-10-CM | POA: Diagnosis not present

## 2015-03-08 DIAGNOSIS — L918 Other hypertrophic disorders of the skin: Secondary | ICD-10-CM | POA: Diagnosis not present

## 2015-03-08 DIAGNOSIS — L821 Other seborrheic keratosis: Secondary | ICD-10-CM | POA: Diagnosis not present

## 2015-03-08 DIAGNOSIS — L738 Other specified follicular disorders: Secondary | ICD-10-CM | POA: Diagnosis not present

## 2015-03-08 DIAGNOSIS — Z85820 Personal history of malignant melanoma of skin: Secondary | ICD-10-CM | POA: Diagnosis not present

## 2015-03-08 DIAGNOSIS — L570 Actinic keratosis: Secondary | ICD-10-CM | POA: Diagnosis not present

## 2015-03-10 DIAGNOSIS — M5033 Other cervical disc degeneration, cervicothoracic region: Secondary | ICD-10-CM | POA: Diagnosis not present

## 2015-03-10 DIAGNOSIS — M791 Myalgia: Secondary | ICD-10-CM | POA: Diagnosis not present

## 2015-03-10 DIAGNOSIS — M5384 Other specified dorsopathies, thoracic region: Secondary | ICD-10-CM | POA: Diagnosis not present

## 2015-03-10 DIAGNOSIS — M9901 Segmental and somatic dysfunction of cervical region: Secondary | ICD-10-CM | POA: Diagnosis not present

## 2015-03-10 DIAGNOSIS — M4003 Postural kyphosis, cervicothoracic region: Secondary | ICD-10-CM | POA: Diagnosis not present

## 2015-03-10 DIAGNOSIS — M9902 Segmental and somatic dysfunction of thoracic region: Secondary | ICD-10-CM | POA: Diagnosis not present

## 2015-03-11 DIAGNOSIS — M791 Myalgia: Secondary | ICD-10-CM | POA: Diagnosis not present

## 2015-03-11 DIAGNOSIS — M5384 Other specified dorsopathies, thoracic region: Secondary | ICD-10-CM | POA: Diagnosis not present

## 2015-03-11 DIAGNOSIS — M4003 Postural kyphosis, cervicothoracic region: Secondary | ICD-10-CM | POA: Diagnosis not present

## 2015-03-11 DIAGNOSIS — M9902 Segmental and somatic dysfunction of thoracic region: Secondary | ICD-10-CM | POA: Diagnosis not present

## 2015-03-11 DIAGNOSIS — M5033 Other cervical disc degeneration, cervicothoracic region: Secondary | ICD-10-CM | POA: Diagnosis not present

## 2015-03-11 DIAGNOSIS — M9901 Segmental and somatic dysfunction of cervical region: Secondary | ICD-10-CM | POA: Diagnosis not present

## 2015-03-15 DIAGNOSIS — M4003 Postural kyphosis, cervicothoracic region: Secondary | ICD-10-CM | POA: Diagnosis not present

## 2015-03-15 DIAGNOSIS — M5384 Other specified dorsopathies, thoracic region: Secondary | ICD-10-CM | POA: Diagnosis not present

## 2015-03-15 DIAGNOSIS — M9902 Segmental and somatic dysfunction of thoracic region: Secondary | ICD-10-CM | POA: Diagnosis not present

## 2015-03-15 DIAGNOSIS — M5033 Other cervical disc degeneration, cervicothoracic region: Secondary | ICD-10-CM | POA: Diagnosis not present

## 2015-03-15 DIAGNOSIS — M9901 Segmental and somatic dysfunction of cervical region: Secondary | ICD-10-CM | POA: Diagnosis not present

## 2015-03-15 DIAGNOSIS — M791 Myalgia: Secondary | ICD-10-CM | POA: Diagnosis not present

## 2015-03-17 DIAGNOSIS — M5384 Other specified dorsopathies, thoracic region: Secondary | ICD-10-CM | POA: Diagnosis not present

## 2015-03-17 DIAGNOSIS — M5033 Other cervical disc degeneration, cervicothoracic region: Secondary | ICD-10-CM | POA: Diagnosis not present

## 2015-03-17 DIAGNOSIS — M4003 Postural kyphosis, cervicothoracic region: Secondary | ICD-10-CM | POA: Diagnosis not present

## 2015-03-17 DIAGNOSIS — M9902 Segmental and somatic dysfunction of thoracic region: Secondary | ICD-10-CM | POA: Diagnosis not present

## 2015-03-17 DIAGNOSIS — M791 Myalgia: Secondary | ICD-10-CM | POA: Diagnosis not present

## 2015-03-17 DIAGNOSIS — M9901 Segmental and somatic dysfunction of cervical region: Secondary | ICD-10-CM | POA: Diagnosis not present

## 2015-03-21 DIAGNOSIS — M791 Myalgia: Secondary | ICD-10-CM | POA: Diagnosis not present

## 2015-03-21 DIAGNOSIS — M9902 Segmental and somatic dysfunction of thoracic region: Secondary | ICD-10-CM | POA: Diagnosis not present

## 2015-03-21 DIAGNOSIS — M9901 Segmental and somatic dysfunction of cervical region: Secondary | ICD-10-CM | POA: Diagnosis not present

## 2015-03-21 DIAGNOSIS — M5033 Other cervical disc degeneration, cervicothoracic region: Secondary | ICD-10-CM | POA: Diagnosis not present

## 2015-03-21 DIAGNOSIS — M4003 Postural kyphosis, cervicothoracic region: Secondary | ICD-10-CM | POA: Diagnosis not present

## 2015-03-21 DIAGNOSIS — M5384 Other specified dorsopathies, thoracic region: Secondary | ICD-10-CM | POA: Diagnosis not present

## 2015-03-24 DIAGNOSIS — M9902 Segmental and somatic dysfunction of thoracic region: Secondary | ICD-10-CM | POA: Diagnosis not present

## 2015-03-24 DIAGNOSIS — M9901 Segmental and somatic dysfunction of cervical region: Secondary | ICD-10-CM | POA: Diagnosis not present

## 2015-03-24 DIAGNOSIS — M5384 Other specified dorsopathies, thoracic region: Secondary | ICD-10-CM | POA: Diagnosis not present

## 2015-03-24 DIAGNOSIS — M5033 Other cervical disc degeneration, cervicothoracic region: Secondary | ICD-10-CM | POA: Diagnosis not present

## 2015-03-24 DIAGNOSIS — M791 Myalgia: Secondary | ICD-10-CM | POA: Diagnosis not present

## 2015-03-24 DIAGNOSIS — M4003 Postural kyphosis, cervicothoracic region: Secondary | ICD-10-CM | POA: Diagnosis not present

## 2015-03-29 DIAGNOSIS — M791 Myalgia: Secondary | ICD-10-CM | POA: Diagnosis not present

## 2015-03-29 DIAGNOSIS — M9901 Segmental and somatic dysfunction of cervical region: Secondary | ICD-10-CM | POA: Diagnosis not present

## 2015-03-29 DIAGNOSIS — M4003 Postural kyphosis, cervicothoracic region: Secondary | ICD-10-CM | POA: Diagnosis not present

## 2015-03-29 DIAGNOSIS — M5033 Other cervical disc degeneration, cervicothoracic region: Secondary | ICD-10-CM | POA: Diagnosis not present

## 2015-03-29 DIAGNOSIS — M5384 Other specified dorsopathies, thoracic region: Secondary | ICD-10-CM | POA: Diagnosis not present

## 2015-03-29 DIAGNOSIS — M9902 Segmental and somatic dysfunction of thoracic region: Secondary | ICD-10-CM | POA: Diagnosis not present

## 2015-03-31 DIAGNOSIS — M5384 Other specified dorsopathies, thoracic region: Secondary | ICD-10-CM | POA: Diagnosis not present

## 2015-03-31 DIAGNOSIS — M5033 Other cervical disc degeneration, cervicothoracic region: Secondary | ICD-10-CM | POA: Diagnosis not present

## 2015-03-31 DIAGNOSIS — M4003 Postural kyphosis, cervicothoracic region: Secondary | ICD-10-CM | POA: Diagnosis not present

## 2015-03-31 DIAGNOSIS — M9901 Segmental and somatic dysfunction of cervical region: Secondary | ICD-10-CM | POA: Diagnosis not present

## 2015-03-31 DIAGNOSIS — M791 Myalgia: Secondary | ICD-10-CM | POA: Diagnosis not present

## 2015-03-31 DIAGNOSIS — M9902 Segmental and somatic dysfunction of thoracic region: Secondary | ICD-10-CM | POA: Diagnosis not present

## 2015-04-09 DIAGNOSIS — Z23 Encounter for immunization: Secondary | ICD-10-CM | POA: Diagnosis not present

## 2015-07-28 ENCOUNTER — Other Ambulatory Visit: Payer: Self-pay | Admitting: Internal Medicine

## 2015-07-28 DIAGNOSIS — M5030 Other cervical disc degeneration, unspecified cervical region: Secondary | ICD-10-CM

## 2015-08-04 ENCOUNTER — Ambulatory Visit
Admission: RE | Admit: 2015-08-04 | Discharge: 2015-08-04 | Disposition: A | Discharge status: Home / Self Care | Payer: Medicare Other | Source: Ambulatory Visit | Attending: Internal Medicine | Admitting: Internal Medicine

## 2015-08-04 DIAGNOSIS — M5030 Other cervical disc degeneration, unspecified cervical region: Secondary | ICD-10-CM

## 2015-08-04 DIAGNOSIS — M4802 Spinal stenosis, cervical region: Secondary | ICD-10-CM | POA: Diagnosis not present

## 2015-08-10 DIAGNOSIS — L570 Actinic keratosis: Secondary | ICD-10-CM | POA: Diagnosis not present

## 2015-08-23 DIAGNOSIS — Z6828 Body mass index (BMI) 28.0-28.9, adult: Secondary | ICD-10-CM | POA: Diagnosis not present

## 2015-08-23 DIAGNOSIS — M542 Cervicalgia: Secondary | ICD-10-CM | POA: Diagnosis not present

## 2015-09-27 DIAGNOSIS — M542 Cervicalgia: Secondary | ICD-10-CM | POA: Diagnosis not present

## 2015-09-27 DIAGNOSIS — M47812 Spondylosis without myelopathy or radiculopathy, cervical region: Secondary | ICD-10-CM | POA: Diagnosis not present

## 2015-10-13 DIAGNOSIS — M47812 Spondylosis without myelopathy or radiculopathy, cervical region: Secondary | ICD-10-CM | POA: Diagnosis not present

## 2015-10-13 DIAGNOSIS — R7301 Impaired fasting glucose: Secondary | ICD-10-CM | POA: Diagnosis not present

## 2015-10-13 DIAGNOSIS — I10 Essential (primary) hypertension: Secondary | ICD-10-CM | POA: Diagnosis not present

## 2015-10-13 DIAGNOSIS — E7800 Pure hypercholesterolemia, unspecified: Secondary | ICD-10-CM | POA: Diagnosis not present

## 2015-10-13 DIAGNOSIS — Z125 Encounter for screening for malignant neoplasm of prostate: Secondary | ICD-10-CM | POA: Diagnosis not present

## 2015-10-19 DIAGNOSIS — M25569 Pain in unspecified knee: Secondary | ICD-10-CM | POA: Diagnosis not present

## 2015-10-19 DIAGNOSIS — E7800 Pure hypercholesterolemia, unspecified: Secondary | ICD-10-CM | POA: Diagnosis not present

## 2015-10-19 DIAGNOSIS — Z6828 Body mass index (BMI) 28.0-28.9, adult: Secondary | ICD-10-CM | POA: Diagnosis not present

## 2015-10-19 DIAGNOSIS — Z1389 Encounter for screening for other disorder: Secondary | ICD-10-CM | POA: Diagnosis not present

## 2015-10-19 DIAGNOSIS — E663 Overweight: Secondary | ICD-10-CM | POA: Diagnosis not present

## 2015-10-19 DIAGNOSIS — M5030 Other cervical disc degeneration, unspecified cervical region: Secondary | ICD-10-CM | POA: Diagnosis not present

## 2015-10-19 DIAGNOSIS — C61 Malignant neoplasm of prostate: Secondary | ICD-10-CM | POA: Diagnosis not present

## 2015-10-19 DIAGNOSIS — R7301 Impaired fasting glucose: Secondary | ICD-10-CM | POA: Diagnosis not present

## 2015-10-19 DIAGNOSIS — C439 Malignant melanoma of skin, unspecified: Secondary | ICD-10-CM | POA: Diagnosis not present

## 2015-10-19 DIAGNOSIS — Z0000 Encounter for general adult medical examination without abnormal findings: Secondary | ICD-10-CM | POA: Diagnosis not present

## 2015-10-19 DIAGNOSIS — E876 Hypokalemia: Secondary | ICD-10-CM | POA: Diagnosis not present

## 2015-10-19 DIAGNOSIS — D126 Benign neoplasm of colon, unspecified: Secondary | ICD-10-CM | POA: Diagnosis not present

## 2015-10-20 DIAGNOSIS — M47812 Spondylosis without myelopathy or radiculopathy, cervical region: Secondary | ICD-10-CM | POA: Diagnosis not present

## 2015-10-20 DIAGNOSIS — Z6827 Body mass index (BMI) 27.0-27.9, adult: Secondary | ICD-10-CM | POA: Diagnosis not present

## 2015-10-20 DIAGNOSIS — M542 Cervicalgia: Secondary | ICD-10-CM | POA: Diagnosis not present

## 2015-11-21 DIAGNOSIS — M47812 Spondylosis without myelopathy or radiculopathy, cervical region: Secondary | ICD-10-CM | POA: Diagnosis not present

## 2015-11-21 DIAGNOSIS — M542 Cervicalgia: Secondary | ICD-10-CM | POA: Diagnosis not present

## 2015-12-06 DIAGNOSIS — M542 Cervicalgia: Secondary | ICD-10-CM | POA: Diagnosis not present

## 2015-12-06 DIAGNOSIS — M47812 Spondylosis without myelopathy or radiculopathy, cervical region: Secondary | ICD-10-CM | POA: Diagnosis not present

## 2016-01-30 DIAGNOSIS — M47812 Spondylosis without myelopathy or radiculopathy, cervical region: Secondary | ICD-10-CM | POA: Diagnosis not present

## 2016-02-07 DIAGNOSIS — M47812 Spondylosis without myelopathy or radiculopathy, cervical region: Secondary | ICD-10-CM | POA: Diagnosis not present

## 2016-02-21 DIAGNOSIS — M180 Bilateral primary osteoarthritis of first carpometacarpal joints: Secondary | ICD-10-CM | POA: Diagnosis not present

## 2016-03-13 DIAGNOSIS — Z85820 Personal history of malignant melanoma of skin: Secondary | ICD-10-CM | POA: Diagnosis not present

## 2016-03-13 DIAGNOSIS — D225 Melanocytic nevi of trunk: Secondary | ICD-10-CM | POA: Diagnosis not present

## 2016-03-13 DIAGNOSIS — L570 Actinic keratosis: Secondary | ICD-10-CM | POA: Diagnosis not present

## 2016-03-13 DIAGNOSIS — D692 Other nonthrombocytopenic purpura: Secondary | ICD-10-CM | POA: Diagnosis not present

## 2016-03-13 DIAGNOSIS — L814 Other melanin hyperpigmentation: Secondary | ICD-10-CM | POA: Diagnosis not present

## 2016-03-13 DIAGNOSIS — L821 Other seborrheic keratosis: Secondary | ICD-10-CM | POA: Diagnosis not present

## 2016-03-20 DIAGNOSIS — M542 Cervicalgia: Secondary | ICD-10-CM | POA: Diagnosis not present

## 2016-03-20 DIAGNOSIS — M47812 Spondylosis without myelopathy or radiculopathy, cervical region: Secondary | ICD-10-CM | POA: Diagnosis not present

## 2016-03-20 DIAGNOSIS — M180 Bilateral primary osteoarthritis of first carpometacarpal joints: Secondary | ICD-10-CM | POA: Diagnosis not present

## 2016-04-21 DIAGNOSIS — Z23 Encounter for immunization: Secondary | ICD-10-CM | POA: Diagnosis not present

## 2016-05-15 DIAGNOSIS — Z8546 Personal history of malignant neoplasm of prostate: Secondary | ICD-10-CM | POA: Diagnosis not present

## 2016-05-21 DIAGNOSIS — R351 Nocturia: Secondary | ICD-10-CM | POA: Diagnosis not present

## 2016-05-21 DIAGNOSIS — N5201 Erectile dysfunction due to arterial insufficiency: Secondary | ICD-10-CM | POA: Diagnosis not present

## 2016-05-29 DIAGNOSIS — L578 Other skin changes due to chronic exposure to nonionizing radiation: Secondary | ICD-10-CM | POA: Diagnosis not present

## 2016-05-29 DIAGNOSIS — L814 Other melanin hyperpigmentation: Secondary | ICD-10-CM | POA: Diagnosis not present

## 2016-08-14 DIAGNOSIS — Z96652 Presence of left artificial knee joint: Secondary | ICD-10-CM | POA: Diagnosis not present

## 2016-08-14 DIAGNOSIS — M1711 Unilateral primary osteoarthritis, right knee: Secondary | ICD-10-CM | POA: Diagnosis not present

## 2016-08-14 DIAGNOSIS — Z471 Aftercare following joint replacement surgery: Secondary | ICD-10-CM | POA: Diagnosis not present

## 2016-10-18 ENCOUNTER — Encounter: Payer: Self-pay | Admitting: Gastroenterology

## 2016-10-23 DIAGNOSIS — Z125 Encounter for screening for malignant neoplasm of prostate: Secondary | ICD-10-CM | POA: Diagnosis not present

## 2016-10-23 DIAGNOSIS — I10 Essential (primary) hypertension: Secondary | ICD-10-CM | POA: Diagnosis not present

## 2016-10-23 DIAGNOSIS — E7800 Pure hypercholesterolemia, unspecified: Secondary | ICD-10-CM | POA: Diagnosis not present

## 2016-10-23 DIAGNOSIS — R7301 Impaired fasting glucose: Secondary | ICD-10-CM | POA: Diagnosis not present

## 2016-10-30 DIAGNOSIS — I10 Essential (primary) hypertension: Secondary | ICD-10-CM | POA: Diagnosis not present

## 2016-10-30 DIAGNOSIS — M5030 Other cervical disc degeneration, unspecified cervical region: Secondary | ICD-10-CM | POA: Diagnosis not present

## 2016-10-30 DIAGNOSIS — Z0000 Encounter for general adult medical examination without abnormal findings: Secondary | ICD-10-CM | POA: Diagnosis not present

## 2016-10-30 DIAGNOSIS — E7800 Pure hypercholesterolemia, unspecified: Secondary | ICD-10-CM | POA: Diagnosis not present

## 2016-10-30 DIAGNOSIS — D126 Benign neoplasm of colon, unspecified: Secondary | ICD-10-CM | POA: Diagnosis not present

## 2016-10-30 DIAGNOSIS — M1990 Unspecified osteoarthritis, unspecified site: Secondary | ICD-10-CM | POA: Diagnosis not present

## 2016-10-30 DIAGNOSIS — C61 Malignant neoplasm of prostate: Secondary | ICD-10-CM | POA: Diagnosis not present

## 2016-10-30 DIAGNOSIS — Z6828 Body mass index (BMI) 28.0-28.9, adult: Secondary | ICD-10-CM | POA: Diagnosis not present

## 2016-10-30 DIAGNOSIS — Z1389 Encounter for screening for other disorder: Secondary | ICD-10-CM | POA: Diagnosis not present

## 2016-10-30 DIAGNOSIS — R7301 Impaired fasting glucose: Secondary | ICD-10-CM | POA: Diagnosis not present

## 2016-10-30 DIAGNOSIS — M542 Cervicalgia: Secondary | ICD-10-CM | POA: Diagnosis not present

## 2016-10-30 DIAGNOSIS — M25569 Pain in unspecified knee: Secondary | ICD-10-CM | POA: Diagnosis not present

## 2016-11-01 ENCOUNTER — Encounter: Payer: Self-pay | Admitting: Gastroenterology

## 2016-12-20 ENCOUNTER — Ambulatory Visit (AMBULATORY_SURGERY_CENTER): Payer: Self-pay | Admitting: *Deleted

## 2016-12-20 VITALS — Ht 69.0 in | Wt 195.0 lb

## 2016-12-20 DIAGNOSIS — Z86010 Personal history of colonic polyps: Secondary | ICD-10-CM

## 2016-12-20 MED ORDER — NA SULFATE-K SULFATE-MG SULF 17.5-3.13-1.6 GM/177ML PO SOLN: 1.0000 | Freq: Once | ORAL | 0 refills | Status: AC

## 2016-12-20 NOTE — Progress Notes (Signed)
No egg or soy allergy known to patient  No issues with past sedation with any surgeries  or procedures, no intubation problems  No diet pills per patient No home 02 use per patient  No blood thinners per patient  Pt denies issues with constipation  No A fib or A flutter  EMMI video declined-  Pay no more than $50 to pt for suprep

## 2017-01-03 ENCOUNTER — Encounter: Payer: Medicare Other | Admitting: Gastroenterology

## 2017-01-09 ENCOUNTER — Encounter: Payer: Self-pay | Admitting: Gastroenterology

## 2017-01-09 ENCOUNTER — Ambulatory Visit (AMBULATORY_SURGERY_CENTER): Payer: Medicare Other | Admitting: Gastroenterology

## 2017-01-09 VITALS — BP 113/54 | HR 76 | Temp 98.0°F | Resp 16 | Ht 69.0 in | Wt 195.0 lb

## 2017-01-09 DIAGNOSIS — Z86010 Personal history of colonic polyps: Secondary | ICD-10-CM | POA: Diagnosis not present

## 2017-01-09 DIAGNOSIS — D124 Benign neoplasm of descending colon: Secondary | ICD-10-CM | POA: Diagnosis not present

## 2017-01-09 DIAGNOSIS — K635 Polyp of colon: Secondary | ICD-10-CM

## 2017-01-09 DIAGNOSIS — D125 Benign neoplasm of sigmoid colon: Secondary | ICD-10-CM

## 2017-01-09 DIAGNOSIS — D123 Benign neoplasm of transverse colon: Secondary | ICD-10-CM

## 2017-01-09 DIAGNOSIS — D120 Benign neoplasm of cecum: Secondary | ICD-10-CM | POA: Diagnosis not present

## 2017-01-09 DIAGNOSIS — D127 Benign neoplasm of rectosigmoid junction: Secondary | ICD-10-CM | POA: Diagnosis not present

## 2017-01-09 DIAGNOSIS — D122 Benign neoplasm of ascending colon: Secondary | ICD-10-CM | POA: Diagnosis not present

## 2017-01-09 MED ORDER — SODIUM CHLORIDE 0.9 % IV SOLN: 500.0000 mL | INTRAVENOUS | Status: DC

## 2017-01-09 NOTE — Progress Notes (Signed)
Pt's states no medical or surgical changes since previsit or office visit. 

## 2017-01-09 NOTE — Op Note (Signed)
[facility] Patient Name: Donald [name] Procedure Date: 01/09/2017 11:16 AM MRN: [id number] Endoscopist: Remo Lipps P. Armbruster MD, MD Age: 71 Referring MD:  Date of Birth: 1946-07-08 Gender: Male Account #: [id number] Procedure:                Colonoscopy Indications:              Surveillance: Personal history of adenomatous                            polyps on last colonoscopy 5 years ago Medicines:                Monitored Anesthesia Care Procedure:                Pre-Anesthesia Assessment:                           - Prior to the procedure, a History and Physical                            was performed, and patient medications and                            allergies were reviewed. The patient's tolerance of                            previous anesthesia was also reviewed. The risks                            and benefits of the procedure and the sedation                            options and risks were discussed with the patient.                            All questions were answered, and informed consent                            was obtained. Prior Anticoagulants: The patient has                            taken no previous anticoagulant or antiplatelet                            agents. ASA Grade Assessment: II - A patient with                            mild systemic disease. After reviewing the risks                            and benefits, the patient was deemed in                            satisfactory condition to undergo the procedure.  After obtaining informed consent, the colonoscope                            was passed under direct vision. Throughout the                            procedure, the patient's blood pressure, pulse, and                            oxygen saturations were monitored continuously. The                            Model CF-HQ190L [id number]) scope was introduced                            through the anus and  advanced to the the cecum,                            identified by appendiceal orifice and ileocecal                            valve. The colonoscopy was performed without                            difficulty. The patient tolerated the procedure                            well. The quality of the bowel preparation was                            good. The ileocecal valve, appendiceal orifice, and                            rectum were photographed. Scope In: 11:21:15 AM Scope Out: 11:52:06 AM Scope Withdrawal Time: 0 hours 26 minutes 4 seconds  Total Procedure Duration: 0 hours 30 minutes 51 seconds  Findings:                 The perianal and digital rectal examinations were                            normal.                           A 4 mm polyp was found in the ileocecal valve. The                            polyp was sessile. The polyp was removed with a                            cold snare. Resection and retrieval were complete.                           A diminutive polyp was found in the ileocecal  valve. The polyp was sessile. The polyp was removed                            with a cold biopsy forceps. Resection and retrieval                            were complete.                           A 3 mm polyp was found in the ascending colon. The                            polyp was sessile. The polyp was removed with a                            cold snare. Resection and retrieval were complete.                           Three sessile polyps were found in the transverse                            colon. The polyps were 3 to 5 mm in size. These                            polyps were removed with a cold snare. Resection                            and retrieval were complete.                           Two sessile polyps were found in the descending                            colon. The polyps were 4 to 5 mm in size. These                            polyps  were removed with a cold snare. Resection                            and retrieval were complete.                           Two sessile polyps were found in the sigmoid colon.                            The polyps were 3 to 5 mm in size. These polyps                            were removed with a cold snare. Resection and                            retrieval were complete.  A 4 mm polyp was found in the recto-sigmoid colon.                            The polyp was sessile. The polyp was removed with a                            cold snare. Resection and retrieval were complete.                           Multiple medium-mouthed diverticula were found in                            the entire colon.                           The exam was otherwise without abnormality. Complications:            No immediate complications. Estimated blood loss:                            Minimal. Estimated Blood Loss:     Estimated blood loss was minimal. Impression:               - One 4 mm polyp at the ileocecal valve, removed                            with a cold snare. Resected and retrieved.                           - One diminutive polyp at the ileocecal valve,                            removed with a cold biopsy forceps. Resected and                            retrieved.                           - One 3 mm polyp in the ascending colon, removed                            with a cold snare. Resected and retrieved.                           - Three 3 to 5 mm polyps in the transverse colon,                            removed with a cold snare. Resected and retrieved.                           - Two 4 to 5 mm polyps in the descending colon,                            removed with a cold snare.  Resected and retrieved.                           - Two 3 to 5 mm polyps in the sigmoid colon,                            removed with a cold snare. Resected and retrieved.                            - One 4 mm polyp at the recto-sigmoid colon,                            removed with a cold snare. Resected and retrieved.                           - Diverticulosis in the entire examined colon.                           - The examination was otherwise normal. Recommendation:           - Patient has a contact number available for                            emergencies. The signs and symptoms of potential                            delayed complications were discussed with the                            patient. Return to normal activities tomorrow.                            Written discharge instructions were provided to the                            patient.                           - Resume previous diet.                           - Continue present medications.                           - Await pathology results.                           - Repeat colonoscopy is recommended for                            surveillance. The colonoscopy date will be                            determined after pathology results from today's  exam become available for review.                           - No ibuprofen, naproxen, or other non-steroidal                            anti-inflammatory drugs for 2 weeks after polyp                            removal. Remo Lipps P. Armbruster MD, MD 01/09/2017 11:58:35 AM This report has been signed electronically.

## 2017-01-09 NOTE — Progress Notes (Signed)
Called to room to assist during endoscopic procedure.  Patient ID and intended procedure confirmed with present staff. Received instructions for my participation in the procedure from the performing physician.  

## 2017-01-09 NOTE — Progress Notes (Signed)
A/ox3 pleased with MAC, report to Karen RN 

## 2017-01-09 NOTE — Patient Instructions (Signed)
  NO NSAIDS(MOTRIN,IBUPROFEN,ADVIL, NAPROSYN, ALEVE, ETC) FOR TWO WEEKS, July 4,2018.   YOU HAD AN ENDOSCOPIC PROCEDURE TODAY AT [facility]:   Refer to the procedure report that was given to you for any specific questions about what was found during the examination.  If the procedure report does not answer your questions, please call your gastroenterologist to clarify.  If you requested that your care partner not be given the details of your procedure findings, then the procedure report has been included in a sealed envelope for you to review at your convenience later.  YOU SHOULD EXPECT: Some feelings of bloating in the abdomen. Passage of more gas than usual.  Walking can help get rid of the air that was put into your GI tract during the procedure and reduce the bloating. If you had a lower endoscopy (such as a colonoscopy or flexible sigmoidoscopy) you may notice spotting of blood in your stool or on the toilet paper. If you underwent a bowel prep for your procedure, you may not have a normal bowel movement for a few days.  Please Note:  You might notice some irritation and congestion in your nose or some drainage.  This is from the oxygen used during your procedure.  There is no need for concern and it should clear up in a day or so.  SYMPTOMS TO REPORT IMMEDIATELY:   Following lower endoscopy (colonoscopy or flexible sigmoidoscopy):  Excessive amounts of blood in the stool  Significant tenderness or worsening of abdominal pains  Swelling of the abdomen that is new, acute  Fever of 100F or higher   For urgent or emergent issues, a gastroenterologist can be reached at any hour by calling [phone].   DIET:  We do recommend a small meal at first, but then you may proceed to your regular diet.  Drink plenty of fluids but you should avoid alcoholic beverages for 24 hours.  ACTIVITY:  You should plan to take it easy for the rest of today and you should NOT DRIVE or use  heavy machinery until tomorrow (because of the sedation medicines used during the test).    FOLLOW UP: Our staff will call the number listed on your records the next business day following your procedure to check on you and address any questions or concerns that you may have regarding the information given to you following your procedure. If we do not reach you, we will leave a message.  However, if you are feeling well and you are not experiencing any problems, there is no need to return our call.  We will assume that you have returned to your regular daily activities without incident.  If any biopsies were taken you will be contacted by phone or by letter within the next 1-3 weeks.  Please call us at [phone] if you have not heard about the biopsies in 3 weeks.    SIGNATURES/CONFIDENTIALITY: You and/or your care partner have signed paperwork which will be entered into your electronic medical record.  These signatures attest to the fact that that the information above on your After Visit Summary has been reviewed and is understood.  Full responsibility of the confidentiality of this discharge information lies with you and/or your care-partner.

## 2017-01-10 ENCOUNTER — Telehealth: Payer: Self-pay | Admitting: *Deleted

## 2017-01-10 NOTE — Telephone Encounter (Signed)
  Follow up Call-  Call back number 01/09/2017  Post procedure Call Back phone  # [phone]  Permission to leave phone message Yes  Some recent data might be hidden     Patient questions:  Do you have a fever, pain , or abdominal swelling? No. Pain Score  0 *  Have you tolerated food without any problems? Yes.    Have you been able to return to your normal activities? Yes.    Do you have any questions about your discharge instructions: Diet   No. Medications  No. Follow up visit  No.  Do you have questions or concerns about your Care? No.  Actions: * If pain score is 4 or above: No action needed, pain <4.

## 2017-01-15 ENCOUNTER — Encounter: Payer: Self-pay | Admitting: Gastroenterology

## 2017-01-15 DIAGNOSIS — M47812 Spondylosis without myelopathy or radiculopathy, cervical region: Secondary | ICD-10-CM | POA: Diagnosis not present

## 2017-01-24 DIAGNOSIS — M47812 Spondylosis without myelopathy or radiculopathy, cervical region: Secondary | ICD-10-CM | POA: Diagnosis not present

## 2017-02-12 DIAGNOSIS — L821 Other seborrheic keratosis: Secondary | ICD-10-CM | POA: Diagnosis not present

## 2017-02-12 DIAGNOSIS — Z6828 Body mass index (BMI) 28.0-28.9, adult: Secondary | ICD-10-CM | POA: Diagnosis not present

## 2017-02-12 DIAGNOSIS — Z79899 Other long term (current) drug therapy: Secondary | ICD-10-CM | POA: Diagnosis not present

## 2017-02-14 DIAGNOSIS — L738 Other specified follicular disorders: Secondary | ICD-10-CM | POA: Diagnosis not present

## 2017-02-14 DIAGNOSIS — D1801 Hemangioma of skin and subcutaneous tissue: Secondary | ICD-10-CM | POA: Diagnosis not present

## 2017-02-14 DIAGNOSIS — L821 Other seborrheic keratosis: Secondary | ICD-10-CM | POA: Diagnosis not present

## 2017-02-14 DIAGNOSIS — L918 Other hypertrophic disorders of the skin: Secondary | ICD-10-CM | POA: Diagnosis not present

## 2017-02-14 DIAGNOSIS — Z85820 Personal history of malignant melanoma of skin: Secondary | ICD-10-CM | POA: Diagnosis not present

## 2017-02-14 DIAGNOSIS — L570 Actinic keratosis: Secondary | ICD-10-CM | POA: Diagnosis not present

## 2017-02-25 DIAGNOSIS — Z6827 Body mass index (BMI) 27.0-27.9, adult: Secondary | ICD-10-CM | POA: Diagnosis not present

## 2017-02-25 DIAGNOSIS — M542 Cervicalgia: Secondary | ICD-10-CM | POA: Diagnosis not present

## 2017-02-25 DIAGNOSIS — M47812 Spondylosis without myelopathy or radiculopathy, cervical region: Secondary | ICD-10-CM | POA: Diagnosis not present

## 2017-03-01 DIAGNOSIS — I10 Essential (primary) hypertension: Secondary | ICD-10-CM | POA: Diagnosis not present

## 2017-03-01 DIAGNOSIS — M542 Cervicalgia: Secondary | ICD-10-CM | POA: Diagnosis not present

## 2017-03-01 DIAGNOSIS — R293 Abnormal posture: Secondary | ICD-10-CM | POA: Diagnosis not present

## 2017-03-01 DIAGNOSIS — M6281 Muscle weakness (generalized): Secondary | ICD-10-CM | POA: Diagnosis not present

## 2017-03-05 DIAGNOSIS — M6281 Muscle weakness (generalized): Secondary | ICD-10-CM | POA: Diagnosis not present

## 2017-03-05 DIAGNOSIS — I10 Essential (primary) hypertension: Secondary | ICD-10-CM | POA: Diagnosis not present

## 2017-03-05 DIAGNOSIS — R293 Abnormal posture: Secondary | ICD-10-CM | POA: Diagnosis not present

## 2017-03-05 DIAGNOSIS — M542 Cervicalgia: Secondary | ICD-10-CM | POA: Diagnosis not present

## 2017-03-08 DIAGNOSIS — M542 Cervicalgia: Secondary | ICD-10-CM | POA: Diagnosis not present

## 2017-03-08 DIAGNOSIS — I10 Essential (primary) hypertension: Secondary | ICD-10-CM | POA: Diagnosis not present

## 2017-03-08 DIAGNOSIS — R293 Abnormal posture: Secondary | ICD-10-CM | POA: Diagnosis not present

## 2017-03-08 DIAGNOSIS — M6281 Muscle weakness (generalized): Secondary | ICD-10-CM | POA: Diagnosis not present

## 2017-03-12 DIAGNOSIS — R293 Abnormal posture: Secondary | ICD-10-CM | POA: Diagnosis not present

## 2017-03-12 DIAGNOSIS — M6281 Muscle weakness (generalized): Secondary | ICD-10-CM | POA: Diagnosis not present

## 2017-03-12 DIAGNOSIS — I10 Essential (primary) hypertension: Secondary | ICD-10-CM | POA: Diagnosis not present

## 2017-03-12 DIAGNOSIS — M542 Cervicalgia: Secondary | ICD-10-CM | POA: Diagnosis not present

## 2017-03-14 DIAGNOSIS — R293 Abnormal posture: Secondary | ICD-10-CM | POA: Diagnosis not present

## 2017-03-14 DIAGNOSIS — M542 Cervicalgia: Secondary | ICD-10-CM | POA: Diagnosis not present

## 2017-03-14 DIAGNOSIS — M6281 Muscle weakness (generalized): Secondary | ICD-10-CM | POA: Diagnosis not present

## 2017-03-14 DIAGNOSIS — I10 Essential (primary) hypertension: Secondary | ICD-10-CM | POA: Diagnosis not present

## 2017-03-18 DIAGNOSIS — M542 Cervicalgia: Secondary | ICD-10-CM | POA: Diagnosis not present

## 2017-03-18 DIAGNOSIS — R293 Abnormal posture: Secondary | ICD-10-CM | POA: Diagnosis not present

## 2017-03-18 DIAGNOSIS — M6281 Muscle weakness (generalized): Secondary | ICD-10-CM | POA: Diagnosis not present

## 2017-03-18 DIAGNOSIS — I10 Essential (primary) hypertension: Secondary | ICD-10-CM | POA: Diagnosis not present

## 2017-03-19 DIAGNOSIS — R0781 Pleurodynia: Secondary | ICD-10-CM | POA: Diagnosis not present

## 2017-03-19 DIAGNOSIS — Z6828 Body mass index (BMI) 28.0-28.9, adult: Secondary | ICD-10-CM | POA: Diagnosis not present

## 2017-03-19 DIAGNOSIS — J61 Pneumoconiosis due to asbestos and other mineral fibers: Secondary | ICD-10-CM | POA: Diagnosis not present

## 2017-03-20 DIAGNOSIS — M6281 Muscle weakness (generalized): Secondary | ICD-10-CM | POA: Diagnosis not present

## 2017-03-20 DIAGNOSIS — I10 Essential (primary) hypertension: Secondary | ICD-10-CM | POA: Diagnosis not present

## 2017-03-20 DIAGNOSIS — R293 Abnormal posture: Secondary | ICD-10-CM | POA: Diagnosis not present

## 2017-03-20 DIAGNOSIS — M542 Cervicalgia: Secondary | ICD-10-CM | POA: Diagnosis not present

## 2017-03-26 DIAGNOSIS — M6281 Muscle weakness (generalized): Secondary | ICD-10-CM | POA: Diagnosis not present

## 2017-03-26 DIAGNOSIS — H25013 Cortical age-related cataract, bilateral: Secondary | ICD-10-CM | POA: Diagnosis not present

## 2017-03-26 DIAGNOSIS — H25043 Posterior subcapsular polar age-related cataract, bilateral: Secondary | ICD-10-CM | POA: Diagnosis not present

## 2017-03-26 DIAGNOSIS — R293 Abnormal posture: Secondary | ICD-10-CM | POA: Diagnosis not present

## 2017-03-26 DIAGNOSIS — M542 Cervicalgia: Secondary | ICD-10-CM | POA: Diagnosis not present

## 2017-03-26 DIAGNOSIS — I10 Essential (primary) hypertension: Secondary | ICD-10-CM | POA: Diagnosis not present

## 2017-03-26 DIAGNOSIS — H2511 Age-related nuclear cataract, right eye: Secondary | ICD-10-CM | POA: Diagnosis not present

## 2017-03-26 DIAGNOSIS — H18413 Arcus senilis, bilateral: Secondary | ICD-10-CM | POA: Diagnosis not present

## 2017-03-26 DIAGNOSIS — H2513 Age-related nuclear cataract, bilateral: Secondary | ICD-10-CM | POA: Diagnosis not present

## 2017-03-29 DIAGNOSIS — R293 Abnormal posture: Secondary | ICD-10-CM | POA: Diagnosis not present

## 2017-03-29 DIAGNOSIS — M542 Cervicalgia: Secondary | ICD-10-CM | POA: Diagnosis not present

## 2017-03-29 DIAGNOSIS — I10 Essential (primary) hypertension: Secondary | ICD-10-CM | POA: Diagnosis not present

## 2017-03-29 DIAGNOSIS — M6281 Muscle weakness (generalized): Secondary | ICD-10-CM | POA: Diagnosis not present

## 2017-04-03 DIAGNOSIS — M542 Cervicalgia: Secondary | ICD-10-CM | POA: Diagnosis not present

## 2017-04-03 DIAGNOSIS — M6281 Muscle weakness (generalized): Secondary | ICD-10-CM | POA: Diagnosis not present

## 2017-04-03 DIAGNOSIS — R293 Abnormal posture: Secondary | ICD-10-CM | POA: Diagnosis not present

## 2017-04-03 DIAGNOSIS — I10 Essential (primary) hypertension: Secondary | ICD-10-CM | POA: Diagnosis not present

## 2017-04-08 DIAGNOSIS — R293 Abnormal posture: Secondary | ICD-10-CM | POA: Diagnosis not present

## 2017-04-08 DIAGNOSIS — M6281 Muscle weakness (generalized): Secondary | ICD-10-CM | POA: Diagnosis not present

## 2017-04-08 DIAGNOSIS — I10 Essential (primary) hypertension: Secondary | ICD-10-CM | POA: Diagnosis not present

## 2017-04-08 DIAGNOSIS — M542 Cervicalgia: Secondary | ICD-10-CM | POA: Diagnosis not present

## 2017-04-10 DIAGNOSIS — M6281 Muscle weakness (generalized): Secondary | ICD-10-CM | POA: Diagnosis not present

## 2017-04-10 DIAGNOSIS — I10 Essential (primary) hypertension: Secondary | ICD-10-CM | POA: Diagnosis not present

## 2017-04-10 DIAGNOSIS — R293 Abnormal posture: Secondary | ICD-10-CM | POA: Diagnosis not present

## 2017-04-10 DIAGNOSIS — M542 Cervicalgia: Secondary | ICD-10-CM | POA: Diagnosis not present

## 2017-04-15 DIAGNOSIS — I10 Essential (primary) hypertension: Secondary | ICD-10-CM | POA: Diagnosis not present

## 2017-04-15 DIAGNOSIS — M6281 Muscle weakness (generalized): Secondary | ICD-10-CM | POA: Diagnosis not present

## 2017-04-15 DIAGNOSIS — R293 Abnormal posture: Secondary | ICD-10-CM | POA: Diagnosis not present

## 2017-04-15 DIAGNOSIS — M542 Cervicalgia: Secondary | ICD-10-CM | POA: Diagnosis not present

## 2017-04-17 DIAGNOSIS — M6281 Muscle weakness (generalized): Secondary | ICD-10-CM | POA: Diagnosis not present

## 2017-04-17 DIAGNOSIS — M542 Cervicalgia: Secondary | ICD-10-CM | POA: Diagnosis not present

## 2017-04-17 DIAGNOSIS — R293 Abnormal posture: Secondary | ICD-10-CM | POA: Diagnosis not present

## 2017-04-17 DIAGNOSIS — I10 Essential (primary) hypertension: Secondary | ICD-10-CM | POA: Diagnosis not present

## 2017-04-25 ENCOUNTER — Encounter: Payer: Self-pay | Admitting: *Deleted

## 2017-04-25 DIAGNOSIS — E7800 Pure hypercholesterolemia, unspecified: Secondary | ICD-10-CM | POA: Insufficient documentation

## 2017-04-25 DIAGNOSIS — F32A Depression, unspecified: Secondary | ICD-10-CM | POA: Insufficient documentation

## 2017-04-25 DIAGNOSIS — F329 Major depressive disorder, single episode, unspecified: Secondary | ICD-10-CM | POA: Insufficient documentation

## 2017-04-25 DIAGNOSIS — H409 Unspecified glaucoma: Secondary | ICD-10-CM | POA: Insufficient documentation

## 2017-04-26 ENCOUNTER — Encounter: Payer: Self-pay | Admitting: Emergency Medicine

## 2017-04-26 ENCOUNTER — Ambulatory Visit (INDEPENDENT_AMBULATORY_CARE_PROVIDER_SITE_OTHER): Payer: Medicare Other | Admitting: Emergency Medicine

## 2017-04-26 VITALS — BP 112/70 | HR 96 | Ht 69.0 in | Wt 193.0 lb

## 2017-04-26 DIAGNOSIS — J920 Pleural plaque with presence of asbestos: Secondary | ICD-10-CM | POA: Diagnosis not present

## 2017-04-26 DIAGNOSIS — J61 Pneumoconiosis due to asbestos and other mineral fibers: Secondary | ICD-10-CM

## 2017-04-26 NOTE — Patient Instructions (Signed)
We will arrange for a CT scan of your chest We will arrange for pulmonary function testing Follow with Dr Lamonte Sakai next available after your testing to review the results and plan our next steps.

## 2017-04-26 NOTE — Assessment & Plan Note (Signed)
Followed on serial chest x-rays, first noted in 2002. Suspect this is almost certainly due to asbestos exposure. No evidence thus far of parenchymal disease. We will follow with CT chest, pulmonary function testing. Decide based on results and his clinical course the timing for serial exams

## 2017-04-26 NOTE — Progress Notes (Signed)
Subjective:    Patient ID: Donald [name], male    DOB: 1946-06-17, 71 y.o.   MRN: [id number]  HPI 71 year old former smoker (10 + pack years) with a history of hypertension, melanoma, prostate cancer, hyperlipidemia. He also has history of asbestos exposure with pleural plaques that were reportedly first noted on chest x-rays from 2002. He began to have routine CXR's in 2015 to follow. His most recent CXR was 2 months ago after a fall, showed suspected progression of plaque and calcium.    He states that his breathing is doing well. He doesn't exercise much. Walks comfortably. Plays golf. He did have some dyspnea when walking up [employer] 2 summers ago. Minimal cough. No wheeze   Review of Systems  Constitutional: Negative for fever and unexpected weight change.  HENT: Negative for congestion, dental problem, ear pain, nosebleeds, postnasal drip, rhinorrhea, sinus pressure, sneezing, sore throat and trouble swallowing.   Eyes: Negative for redness and itching.  Respiratory: Negative for cough, chest tightness, shortness of breath and wheezing.   Cardiovascular: Negative for palpitations and leg swelling.  Gastrointestinal: Negative for nausea and vomiting.  Genitourinary: Negative for dysuria.  Musculoskeletal: Negative for joint swelling.  Skin: Negative for rash.  Neurological: Negative for headaches.  Hematological: Does not bruise/bleed easily.  Psychiatric/Behavioral: Negative for dysphoric mood. The patient is not nervous/anxious.     Past Medical History:  Diagnosis Date  . Arthritis    thumbs, neck,  knees  . Depression   . Diverticulosis   . Hyperlipidemia   . Hypertension   . Prostate cancer ([place])    seed implant  . Tubular adenoma of colon      Family History  Problem Relation Age of Onset  . Heart disease Mother   . Heart disease Father   . Prostate cancer Brother   . Lung cancer Brother   . Colon cancer Neg Hx   . Colon polyps Neg Hx   . Esophageal  cancer Neg Hx   . Rectal cancer Neg Hx   . Stomach cancer Neg Hx      Social History   Social History  . Marital status: Married    Spouse name: N/A  . Number of children: 2  . Years of education: N/A   Occupational History  . retired    Social History Main Topics  . Smoking status: Former Smoker    Packs/day: 0.50    Years: 25.00    Types: Cigarettes    Quit date: 11/27/1978  . Smokeless tobacco: Current User    Types: Chew  . Alcohol use Yes     Comment: social- 2 drinks a month maybe   . Drug use: No  . Sexual activity: Not on file   Other Topics Concern  . Not on file   Social History Narrative  . 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  No pets or hobbies that give inhaled exposure  No Known Allergies   Outpatient Medications Prior to Visit  Medication Sig Dispense Refill  . amLODipine (NORVASC) 10 MG tablet Take 10 mg by mouth daily.    Marland Kitchen atorvastatin (LIPITOR) 10 MG tablet Take 10 mg by mouth daily.    . benazepril (LOTENSIN) 20 MG tablet Take 20 mg by mouth daily.    . Tamsulosin HCl (FLOMAX) 0.4 MG  CAPS Take by mouth 2 (two) times daily.    . temazepam (RESTORIL) 15 MG capsule Take 15 mg by mouth at bedtime.    . triamterene-hydrochlorothiazide (MAXZIDE-25) 37.5-25 MG tablet Take 1 tablet by mouth daily.    Marland Kitchen venlafaxine XR (EFFEXOR-XR) 150 MG 24 hr capsule Take 150 mg by mouth daily.    Marland Kitchen HYDROcodone-acetaminophen (NORCO) 7.5-325 MG per tablet Take 1-2 tablets by mouth 3 (three) times daily.     . potassium chloride (K-DUR) 10 MEQ tablet Take 10 mEq by mouth 2 (two) times daily.     Facility-Administered Medications Prior to Visit  Medication Dose Route Frequency Provider Last Rate Last Dose  . 0.9 %  sodium chloride infusion  500 mL Intravenous Continuous Armbruster, Carlota Raspberry, MD            Objective:   Physical  Exam Vitals:   04/26/17 1343  BP: 112/70  Pulse: 96  SpO2: 95%  Weight: 193 lb (87.5 kg)  Height: 5\' 9"  (1.753 m)   Gen: Pleasant, well-nourished, in no distress,  normal affect  ENT: No lesions,  mouth clear,  oropharynx clear, no postnasal drip  Neck: No JVD, no TMG, no carotid bruits  Lungs: No use of accessory muscles, clear without rales or rhonchi  Cardiovascular: RRR, heart sounds normal, no murmur or gallops, no peripheral edema  Musculoskeletal: No deformities, no cyanosis or clubbing  Neuro: alert, non focal  Skin: Warm, no lesions or rashes      Assessment & Plan:  Calcified pleural plaque due to asbestos exposure Followed on serial chest x-rays, first noted in 2002. Suspect this is almost certainly due to asbestos exposure. No evidence thus far of parenchymal disease. We will follow with CT chest, pulmonary function testing. Decide based on results and his clinical course the timing for serial exams  Baltazar Apo, MD, PhD 04/26/2017, 2:13 PM [facility] [phone] or if no answer [phone]

## 2017-04-29 DIAGNOSIS — I10 Essential (primary) hypertension: Secondary | ICD-10-CM | POA: Diagnosis not present

## 2017-04-29 DIAGNOSIS — Z23 Encounter for immunization: Secondary | ICD-10-CM | POA: Diagnosis not present

## 2017-04-29 DIAGNOSIS — Z6828 Body mass index (BMI) 28.0-28.9, adult: Secondary | ICD-10-CM | POA: Diagnosis not present

## 2017-04-29 DIAGNOSIS — R7301 Impaired fasting glucose: Secondary | ICD-10-CM | POA: Diagnosis not present

## 2017-04-29 DIAGNOSIS — C61 Malignant neoplasm of prostate: Secondary | ICD-10-CM | POA: Diagnosis not present

## 2017-04-29 DIAGNOSIS — J61 Pneumoconiosis due to asbestos and other mineral fibers: Secondary | ICD-10-CM | POA: Diagnosis not present

## 2017-04-29 DIAGNOSIS — M1990 Unspecified osteoarthritis, unspecified site: Secondary | ICD-10-CM | POA: Diagnosis not present

## 2017-05-02 ENCOUNTER — Ambulatory Visit (INDEPENDENT_AMBULATORY_CARE_PROVIDER_SITE_OTHER)
Admission: RE | Admit: 2017-05-02 | Discharge: 2017-05-02 | Disposition: A | Discharge status: Home / Self Care | Payer: Medicare Other | Source: Ambulatory Visit | Attending: Emergency Medicine | Admitting: Emergency Medicine

## 2017-05-02 DIAGNOSIS — S2231XA Fracture of one rib, right side, initial encounter for closed fracture: Secondary | ICD-10-CM | POA: Diagnosis not present

## 2017-05-02 DIAGNOSIS — J61 Pneumoconiosis due to asbestos and other mineral fibers: Secondary | ICD-10-CM

## 2017-05-10 ENCOUNTER — Ambulatory Visit (INDEPENDENT_AMBULATORY_CARE_PROVIDER_SITE_OTHER): Payer: Medicare Other | Admitting: Emergency Medicine

## 2017-05-10 ENCOUNTER — Encounter: Payer: Self-pay | Admitting: Emergency Medicine

## 2017-05-10 VITALS — BP 130/82 | HR 100 | Ht 69.0 in | Wt 193.0 lb

## 2017-05-10 DIAGNOSIS — J920 Pleural plaque with presence of asbestos: Secondary | ICD-10-CM

## 2017-05-10 DIAGNOSIS — J61 Pneumoconiosis due to asbestos and other mineral fibers: Secondary | ICD-10-CM

## 2017-05-10 LAB — PULMONARY FUNCTION TEST
DL/VA % pred: 85 %
DL/VA: 3.86 ml/min/mmHg/L
DLCO cor % pred: 76 %
DLCO cor: 23.77 ml/min/mmHg
DLCO unc % pred: 74 %
DLCO unc: 23.07 ml/min/mmHg
FEF 25-75 Post: 4.37 L/sec
FEF 25-75 Pre: 3.28 L/sec
FEF2575-%Change-Post: 33 %
FEF2575-%Pred-Post: 186 %
FEF2575-%Pred-Pre: 140 %
FEV1-%Change-Post: 6 %
FEV1-%Pred-Post: 105 %
FEV1-%Pred-Pre: 99 %
FEV1-Post: 3.28 L
FEV1-Pre: 3.09 L
FEV1FVC-%Change-Post: 0 %
FEV1FVC-%Pred-Pre: 111 %
FEV6-%Change-Post: 5 %
FEV6-%Pred-Post: 100 %
FEV6-%Pred-Pre: 95 %
FEV6-Post: 3.99 L
FEV6-Pre: 3.79 L
FEV6FVC-%Change-Post: 0 %
FEV6FVC-%Pred-Post: 105 %
FEV6FVC-%Pred-Pre: 105 %
FVC-%Change-Post: 5 %
FVC-%Pred-Post: 95 %
FVC-%Pred-Pre: 90 %
FVC-Post: 4.03 L
FVC-Pre: 3.81 L
Post FEV1/FVC ratio: 82 %
Post FEV6/FVC ratio: 99 %
Pre FEV1/FVC ratio: 81 %
Pre FEV6/FVC Ratio: 99 %
RV % pred: 104 %
RV: 2.52 L
TLC % pred: 99 %
TLC: 6.79 L

## 2017-05-10 NOTE — Progress Notes (Signed)
Subjective:    Patient ID: Donald [name], male    DOB: [birth year], 71 y.o.   MRN: [id number]  HPI 71 year old former smoker (10 + pack years) with a history of hypertension, melanoma, prostate cancer, hyperlipidemia. He also has history of asbestos exposure with pleural plaques that were reportedly first noted on chest x-rays from 2002. He began to have routine CXR's in 2015 to follow. His most recent CXR was 2 months ago after a fall, showed suspected progression of plaque and calcium.    He states that his breathing is doing well. He doesn't exercise much. Walks comfortably. Plays golf. He did have some dyspnea when walking up [employer] 2 summers ago. Minimal cough. No wheeze  ROV 05/10/17 -- This follow-up visit for patient with a history of pleural plaques in the setting of asbestos exposure that date back to 2002. He underwent pulmonary function testing today that I have personally reviewed. This showed normal airflows without a bronchodilator response, normal lung volumes, slightly decreased diffusion capacity that corrects to the normal range when adjusted for his alveolar volume. Flow volume loops were normal. I reviewed his CT chest from 05/02/17, shows calcified and non calcified plaques, no significant parenchymal disease.    Review of Systems  Constitutional: Negative for fever and unexpected weight change.  HENT: Negative for congestion, dental problem, ear pain, nosebleeds, postnasal drip, rhinorrhea, sinus pressure, sneezing, sore throat and trouble swallowing.   Eyes: Negative for redness and itching.  Respiratory: Negative for cough, chest tightness, shortness of breath and wheezing.   Cardiovascular: Negative for palpitations and leg swelling.  Gastrointestinal: Negative for nausea and vomiting.  Genitourinary: Negative for dysuria.  Musculoskeletal: Negative for joint swelling.  Skin: Negative for rash.  Neurological: Negative for headaches.  Hematological: Does not  bruise/bleed easily.  Psychiatric/Behavioral: Negative for dysphoric mood. The patient is not nervous/anxious.     Past Medical History:  Diagnosis Date  . Arthritis    thumbs, neck,  knees  . Depression   . Diverticulosis   . Hyperlipidemia   . Hypertension   . Prostate cancer ([place])    seed implant  . Tubular adenoma of colon      Family History  Problem Relation Age of Onset  . Heart disease Mother   . Heart disease Father   . Prostate cancer Brother   . Lung cancer Brother   . Colon cancer Neg Hx   . Colon polyps Neg Hx   . Esophageal cancer Neg Hx   . Rectal cancer Neg Hx   . Stomach cancer Neg Hx      Social History   Social History  . Marital status: Married    Spouse name: N/A  . Number of children: 2  . Years of education: N/A   Occupational History  . retired    Social History Main Topics  . Smoking status: Former Smoker    Packs/day: 0.50    Years: 25.00    Types: Cigarettes    Quit date: 11/27/1978  . Smokeless tobacco: Current User    Types: Chew  . Alcohol use Yes     Comment: social- 2 drinks a month maybe   . Drug use: No  . Sexual activity: Not on file   Other Topics Concern  . Not on file   Social History Narrative  . 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  No pets or hobbies that give inhaled exposure  No Known Allergies   Outpatient Medications Prior to Visit  Medication Sig Dispense Refill  . amLODipine (NORVASC) 10 MG tablet Take 10 mg by mouth daily.    Marland Kitchen atorvastatin (LIPITOR) 10 MG tablet Take 10 mg by mouth daily.    . benazepril (LOTENSIN) 20 MG tablet Take 20 mg by mouth daily.    . Tamsulosin HCl (FLOMAX) 0.4 MG CAPS Take by mouth 2 (two) times daily.    . temazepam (RESTORIL) 15 MG capsule Take 15 mg by mouth at bedtime.    . triamterene-hydrochlorothiazide (MAXZIDE-25) 37.5-25 MG tablet  Take 1 tablet by mouth daily.    Marland Kitchen venlafaxine XR (EFFEXOR-XR) 150 MG 24 hr capsule Take 150 mg by mouth daily.     Facility-Administered Medications Prior to Visit  Medication Dose Route Frequency Provider Last Rate Last Dose  . 0.9 %  sodium chloride infusion  500 mL Intravenous Continuous Armbruster, Carlota Raspberry, MD            Objective:   Physical Exam Vitals:   05/10/17 1559 05/10/17 1601  BP:  130/82  Pulse:  100  SpO2:  94%  Weight: 193 lb (87.5 kg)   Height: 5\' 9"  (1.753 m)    Gen: Pleasant, well-nourished, in no distress,  normal affect  ENT: No lesions,  mouth clear,  oropharynx clear, no postnasal drip  Neck: No JVD, no TMG, no carotid bruits  Lungs: No use of accessory muscles, clear without rales or rhonchi  Cardiovascular: RRR, heart sounds normal, no murmur or gallops, no peripheral edema  Musculoskeletal: No deformities, no cyanosis or clubbing  Neuro: alert, non focal  Skin: Warm, no lesions or rashes      Assessment & Plan:  Calcified pleural plaque due to asbestos exposure Your pulmonary function testing today is normal. We will use this as a baseline to compare with in the future. We will repeat your pulmonary function testing in October 2019 Your CT scan of the chest shows evidence for calcified and noncalcified pleural plaques. There is no lung tissue disease. We will follow a CT scan of her chest without contrast in October 2019 Follow with Dr Lamonte Sakai in October 2019 after your testing to review the results, or sooner if you have any chest pain or breathing changes.  Baltazar Apo, MD, PhD 05/10/2017, 4:31 PM [facility] [phone] or if no answer [phone]

## 2017-05-10 NOTE — Assessment & Plan Note (Signed)
Your pulmonary function testing today is normal. We will use this as a baseline to compare with in the future. We will repeat your pulmonary function testing in October 2019 Your CT scan of the chest shows evidence for calcified and noncalcified pleural plaques. There is no lung tissue disease. We will follow a CT scan of her chest without contrast in October 2019 Follow with Dr Lamonte Sakai in October 2019 after your testing to review the results, or sooner if you have any chest pain or breathing changes.

## 2017-05-10 NOTE — Patient Instructions (Signed)
PFT done today. 

## 2017-05-10 NOTE — Patient Instructions (Signed)
Your pulmonary function testing today is normal. We will use this as a baseline to compare with in the future. We will repeat your pulmonary function testing in October 2019 Your CT scan of the chest shows evidence for calcified and noncalcified pleural plaques. There is no lung tissue disease. We will follow a CT scan of her chest without contrast in October 2019 Follow with Dr Lamonte Sakai in October 2019 after your testing to review the results, or sooner if you have any chest pain or breathing changes.

## 2017-05-13 DIAGNOSIS — H2513 Age-related nuclear cataract, bilateral: Secondary | ICD-10-CM | POA: Diagnosis not present

## 2017-05-13 DIAGNOSIS — H2511 Age-related nuclear cataract, right eye: Secondary | ICD-10-CM | POA: Diagnosis not present

## 2017-05-14 DIAGNOSIS — H2512 Age-related nuclear cataract, left eye: Secondary | ICD-10-CM | POA: Diagnosis not present

## 2017-06-03 DIAGNOSIS — H2512 Age-related nuclear cataract, left eye: Secondary | ICD-10-CM | POA: Diagnosis not present

## 2017-06-03 DIAGNOSIS — H2513 Age-related nuclear cataract, bilateral: Secondary | ICD-10-CM | POA: Diagnosis not present

## 2017-07-09 DIAGNOSIS — K5792 Diverticulitis of intestine, part unspecified, without perforation or abscess without bleeding: Secondary | ICD-10-CM | POA: Diagnosis not present

## 2017-07-09 DIAGNOSIS — J61 Pneumoconiosis due to asbestos and other mineral fibers: Secondary | ICD-10-CM | POA: Diagnosis not present

## 2017-07-09 DIAGNOSIS — I10 Essential (primary) hypertension: Secondary | ICD-10-CM | POA: Diagnosis not present

## 2017-07-10 ENCOUNTER — Other Ambulatory Visit: Payer: Self-pay | Admitting: Family Medicine

## 2017-07-10 ENCOUNTER — Ambulatory Visit
Admission: RE | Admit: 2017-07-10 | Discharge: 2017-07-10 | Disposition: A | Discharge status: Home / Self Care | Payer: Medicare Other | Source: Ambulatory Visit | Attending: Family Medicine | Admitting: Family Medicine

## 2017-07-10 DIAGNOSIS — K5730 Diverticulosis of large intestine without perforation or abscess without bleeding: Secondary | ICD-10-CM | POA: Diagnosis not present

## 2017-07-10 DIAGNOSIS — K5792 Diverticulitis of intestine, part unspecified, without perforation or abscess without bleeding: Secondary | ICD-10-CM

## 2017-07-10 MED ORDER — IOPAMIDOL (ISOVUE-300) INJECTION 61%
100.0000 mL | Freq: Once | INTRAVENOUS | Status: AC | PRN
  Administered 2017-07-10: 100 mL via INTRAVENOUS

## 2017-07-17 DIAGNOSIS — Z6828 Body mass index (BMI) 28.0-28.9, adult: Secondary | ICD-10-CM | POA: Diagnosis not present

## 2017-07-17 DIAGNOSIS — K5792 Diverticulitis of intestine, part unspecified, without perforation or abscess without bleeding: Secondary | ICD-10-CM | POA: Diagnosis not present

## 2017-07-17 DIAGNOSIS — I10 Essential (primary) hypertension: Secondary | ICD-10-CM | POA: Diagnosis not present

## 2017-07-17 DIAGNOSIS — J61 Pneumoconiosis due to asbestos and other mineral fibers: Secondary | ICD-10-CM | POA: Diagnosis not present

## 2017-09-10 DIAGNOSIS — H04123 Dry eye syndrome of bilateral lacrimal glands: Secondary | ICD-10-CM | POA: Diagnosis not present

## 2017-09-10 DIAGNOSIS — H16143 Punctate keratitis, bilateral: Secondary | ICD-10-CM | POA: Diagnosis not present

## 2017-11-07 DIAGNOSIS — D2271 Melanocytic nevi of right lower limb, including hip: Secondary | ICD-10-CM | POA: Diagnosis not present

## 2017-11-07 DIAGNOSIS — L853 Xerosis cutis: Secondary | ICD-10-CM | POA: Diagnosis not present

## 2017-11-07 DIAGNOSIS — L918 Other hypertrophic disorders of the skin: Secondary | ICD-10-CM | POA: Diagnosis not present

## 2017-11-07 DIAGNOSIS — L821 Other seborrheic keratosis: Secondary | ICD-10-CM | POA: Diagnosis not present

## 2017-11-07 DIAGNOSIS — Z85820 Personal history of malignant melanoma of skin: Secondary | ICD-10-CM | POA: Diagnosis not present

## 2017-11-07 DIAGNOSIS — D692 Other nonthrombocytopenic purpura: Secondary | ICD-10-CM | POA: Diagnosis not present

## 2017-11-07 DIAGNOSIS — D485 Neoplasm of uncertain behavior of skin: Secondary | ICD-10-CM | POA: Diagnosis not present

## 2017-11-07 DIAGNOSIS — I788 Other diseases of capillaries: Secondary | ICD-10-CM | POA: Diagnosis not present

## 2017-11-07 DIAGNOSIS — L570 Actinic keratosis: Secondary | ICD-10-CM | POA: Diagnosis not present

## 2017-11-07 DIAGNOSIS — D225 Melanocytic nevi of trunk: Secondary | ICD-10-CM | POA: Diagnosis not present

## 2017-11-07 DIAGNOSIS — D2261 Melanocytic nevi of right upper limb, including shoulder: Secondary | ICD-10-CM | POA: Diagnosis not present

## 2017-11-09 IMAGING — MR MR CERVICAL SPINE W/O CM
4 of 5 series · 28 of 48 positions shown · non-contrast
Comparison: None.

CLINICAL DATA: Degenerative disc disease of the cervical spine.
Bilateral neck pain over the last 6 months.

EXAM:
MRI CERVICAL SPINE WITHOUT CONTRAST
TECHNIQUE: Multiplanar, multisequence MR imaging of the cervical spine was
performed. No intravenous contrast was administered.

[Series 9: T1 · sagittal · 3.0mm · 0.66mm/px · 6 of 15 slices shown]
[im 1/15]
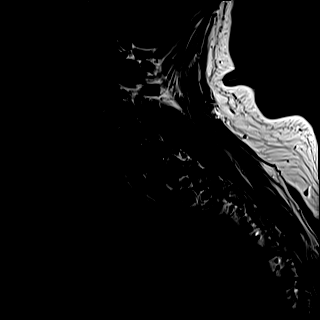
[im 3/15]
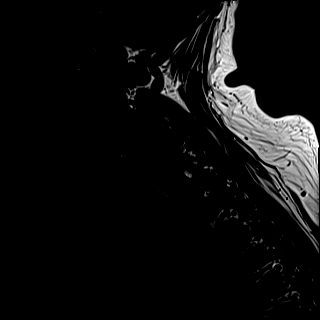
[im 6/15]
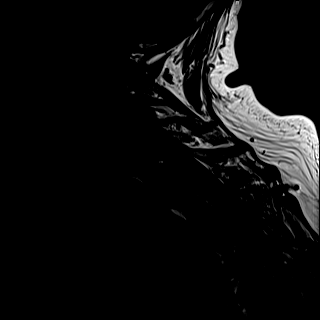
[im 9/15]
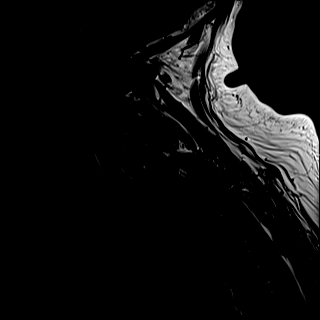
[im 12/15]
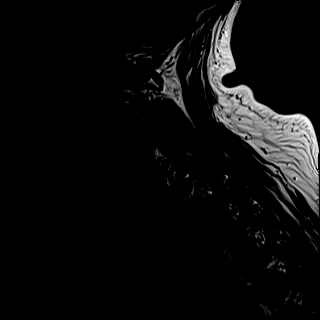
[im 15/15]
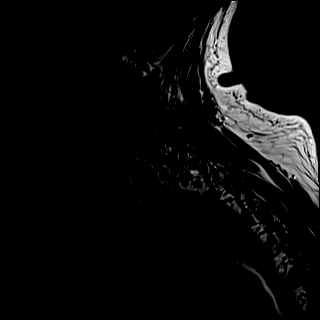

[Series 10: T2 · sagittal · 3.0mm · 0.66mm/px · 7 of 15 slices shown (1 of 2)]
[im 1/15]
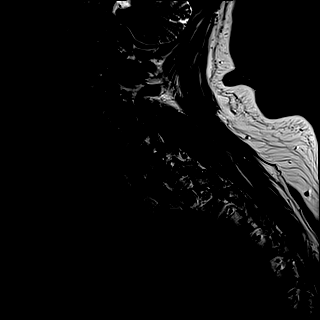
[im 3/15]
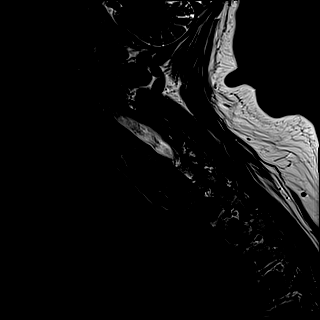
[im 5/15]
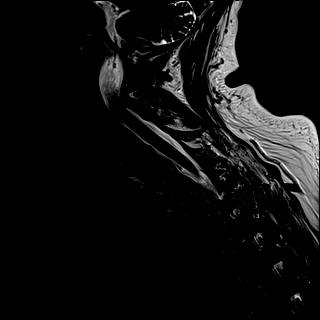
[im 8/15]
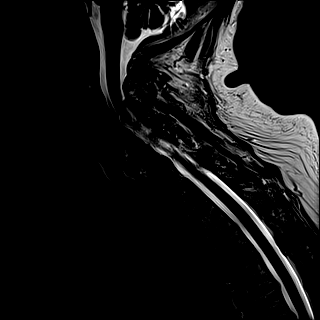
[im 10/15]
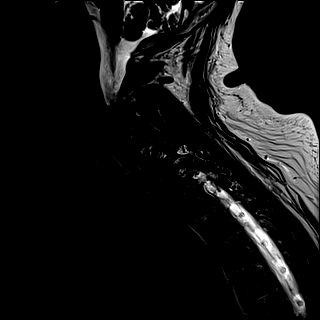
[im 12/15]
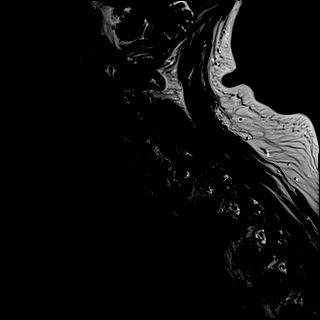
[im 15/15]
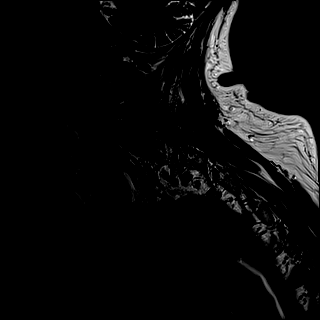

[Series 11: T2 · axial · 3.0mm · 0.56mm/px · z∈[-60,+19]mm · 8 of 30 slices shown (2 of 2)]
[im 1/30]
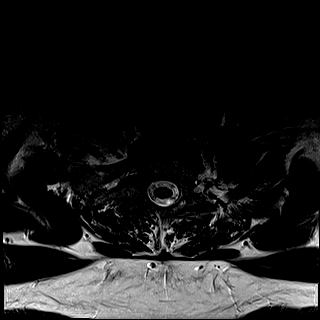
[im 5/30]
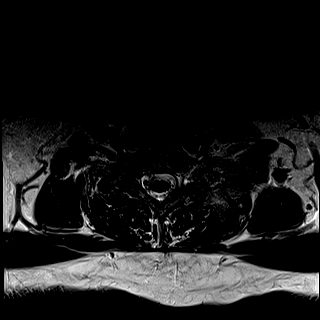
[im 9/30]
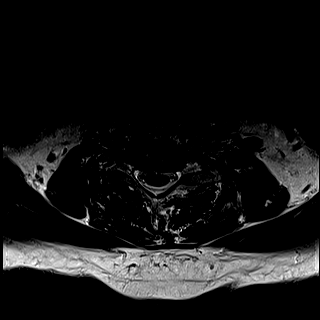
[im 14/30]
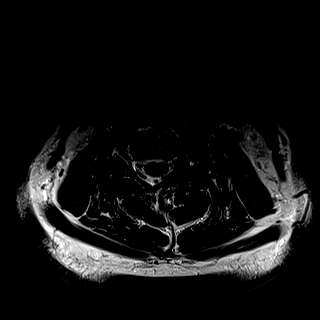
[im 16/30]
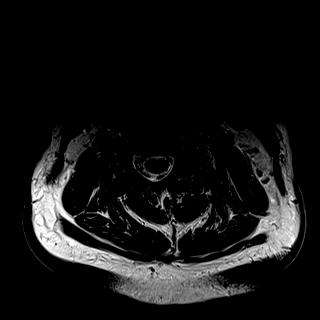
[im 21/30]
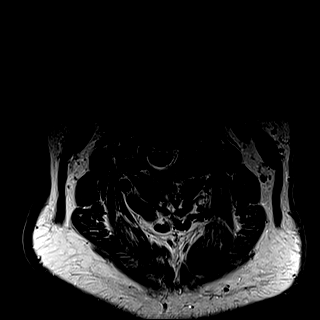
[im 25/30]
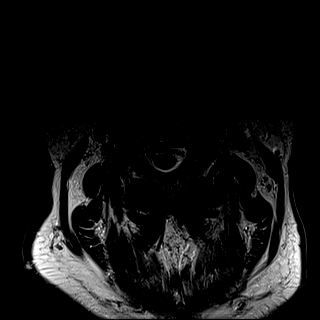
[im 30/30]
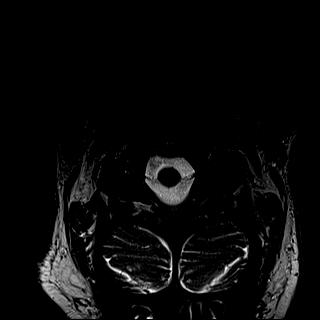

[Series 13: STIR · sagittal · 3.0mm · 0.33mm/px · 7 of 15 slices shown]
[im 1/15]
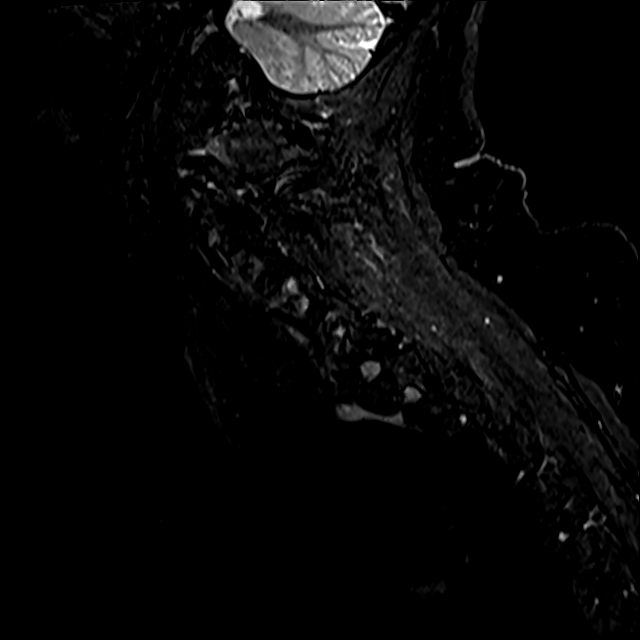
[im 3/15]
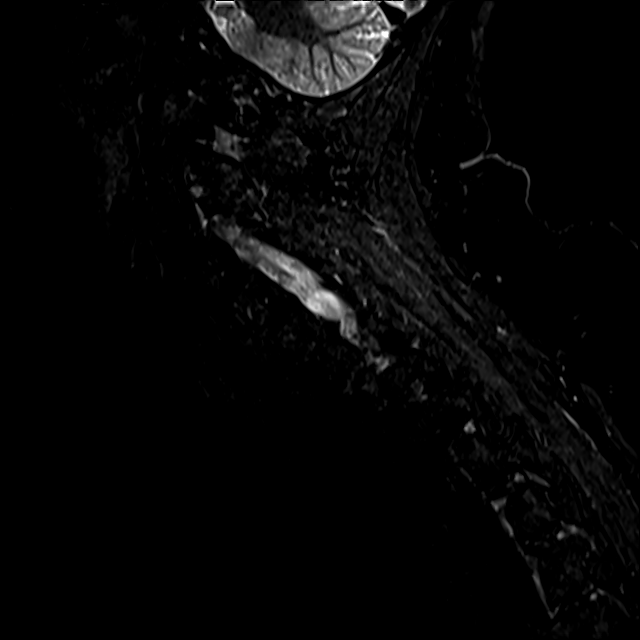
[im 5/15]
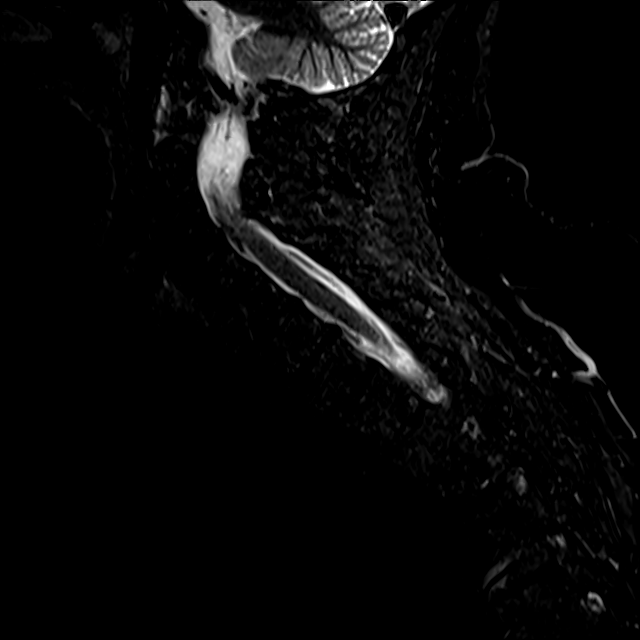
[im 8/15]
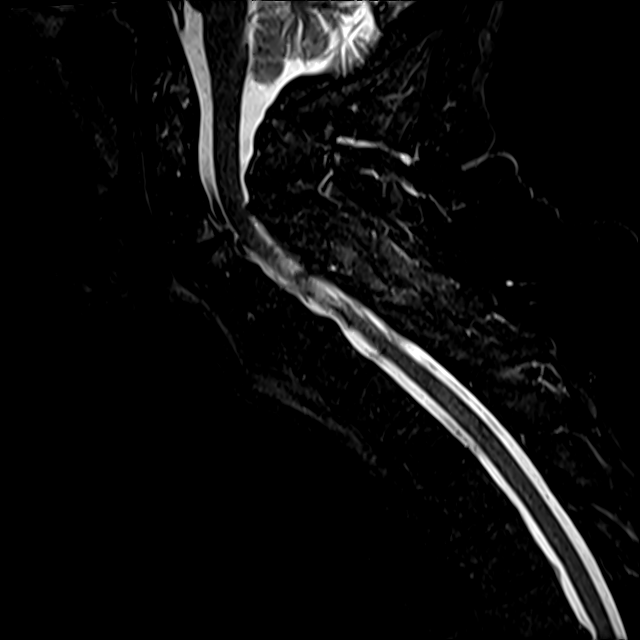
[im 10/15]
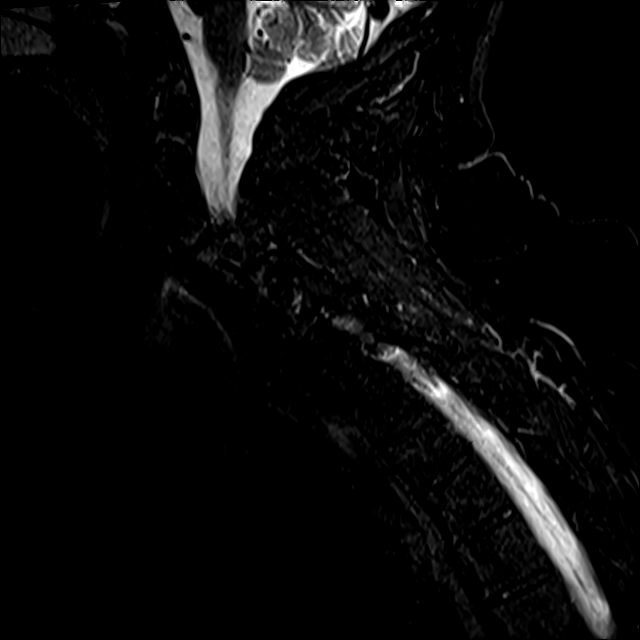
[im 12/15]
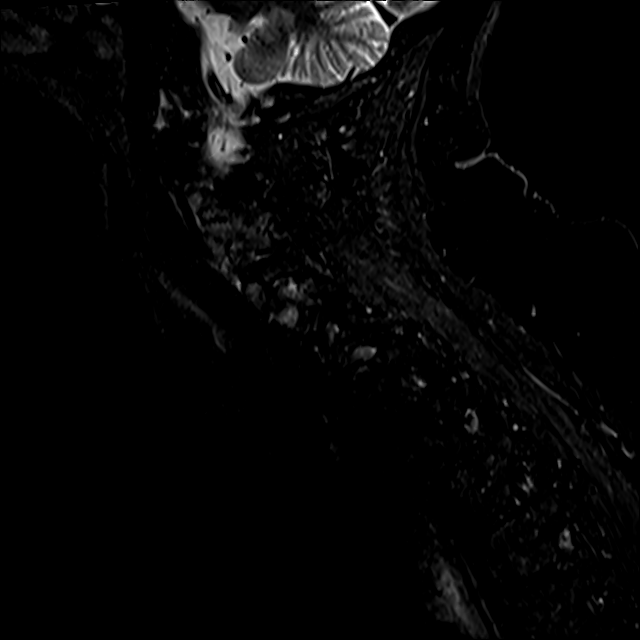
[im 15/15]
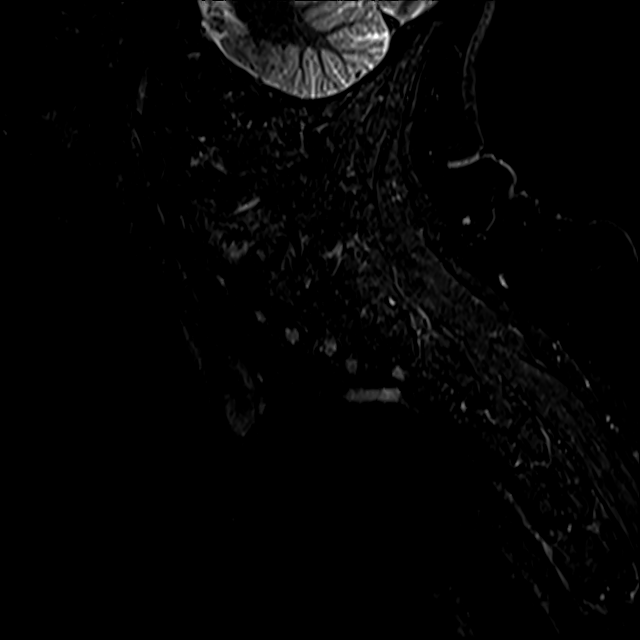

[28 of 48 positions shown; findings below may reference images not displayed]

FINDINGS: Normal signal is present in the cervical and upper thoracic spinal
cord to the lowest imaged level, T4-5. Curvature of the cervical
thoracic spine is convex to the right at C5-6 and to the left at T4.
Craniocervical junction is within normal limits. Vertebral body
heights and alignment are maintained.

C2-3: Mild facet hypertrophy is present bilaterally without
significant stenosis.

C3-4: A leftward disc osteophyte complex is present. Asymmetric
uncovertebral and facet disease lead to moderate to severe left
foraminal stenosis. There is asymmetric mild left central canal
narrowing.

C4-5: Asymmetric left-sided facet hypertrophy and uncovertebral
spurring leads to moderate left foraminal stenosis. The right
foramen is patent.

C5-6: Asymmetric left-sided facet hypertrophy is present.
Uncovertebral spurring is noted bilaterally. This leads to mild left
foraminal narrowing.

C6-7: A mild broad-based disc osteophyte complex is present. There
is partial effacement of the ventral CSF. Mild left central and
foraminal narrowing is present.

C7-T1: Mild facet hypertrophy is present bilaterally. There is no
significant stenosis.
IMPRESSION: 1. The most severe disease is at C3-4 were moderate to severe left
foraminal and central canal stenosis is present.
2. Moderate left foraminal stenosis at C4-5.
3. Mild left foraminal narrowing at C5-6 and C6-7.
4. Mild facet hypertrophy at C2-3 and C7-T1 without significant
stenosis.
5. Mild curvature of the cervical spine as described.

## 2017-12-11 ENCOUNTER — Encounter: Payer: Self-pay | Admitting: Gastroenterology

## 2017-12-12 DIAGNOSIS — I10 Essential (primary) hypertension: Secondary | ICD-10-CM | POA: Diagnosis not present

## 2017-12-12 DIAGNOSIS — R82998 Other abnormal findings in urine: Secondary | ICD-10-CM | POA: Diagnosis not present

## 2017-12-12 DIAGNOSIS — E7800 Pure hypercholesterolemia, unspecified: Secondary | ICD-10-CM | POA: Diagnosis not present

## 2017-12-12 DIAGNOSIS — R7301 Impaired fasting glucose: Secondary | ICD-10-CM | POA: Diagnosis not present

## 2017-12-12 DIAGNOSIS — Z125 Encounter for screening for malignant neoplasm of prostate: Secondary | ICD-10-CM | POA: Diagnosis not present

## 2017-12-13 ENCOUNTER — Encounter: Payer: Self-pay | Admitting: Gastroenterology

## 2017-12-19 DIAGNOSIS — Z0000 Encounter for general adult medical examination without abnormal findings: Secondary | ICD-10-CM | POA: Diagnosis not present

## 2017-12-19 DIAGNOSIS — M5030 Other cervical disc degeneration, unspecified cervical region: Secondary | ICD-10-CM | POA: Diagnosis not present

## 2017-12-19 DIAGNOSIS — J61 Pneumoconiosis due to asbestos and other mineral fibers: Secondary | ICD-10-CM | POA: Diagnosis not present

## 2017-12-19 DIAGNOSIS — R7301 Impaired fasting glucose: Secondary | ICD-10-CM | POA: Diagnosis not present

## 2017-12-19 DIAGNOSIS — C439 Malignant melanoma of skin, unspecified: Secondary | ICD-10-CM | POA: Diagnosis not present

## 2017-12-19 DIAGNOSIS — C61 Malignant neoplasm of prostate: Secondary | ICD-10-CM | POA: Diagnosis not present

## 2017-12-19 DIAGNOSIS — Z6828 Body mass index (BMI) 28.0-28.9, adult: Secondary | ICD-10-CM | POA: Diagnosis not present

## 2017-12-19 DIAGNOSIS — L821 Other seborrheic keratosis: Secondary | ICD-10-CM | POA: Diagnosis not present

## 2017-12-19 DIAGNOSIS — M25562 Pain in left knee: Secondary | ICD-10-CM | POA: Diagnosis not present

## 2017-12-19 DIAGNOSIS — M25561 Pain in right knee: Secondary | ICD-10-CM | POA: Diagnosis not present

## 2017-12-19 DIAGNOSIS — E7849 Other hyperlipidemia: Secondary | ICD-10-CM | POA: Diagnosis not present

## 2017-12-19 DIAGNOSIS — Z1389 Encounter for screening for other disorder: Secondary | ICD-10-CM | POA: Diagnosis not present

## 2017-12-25 DIAGNOSIS — M859 Disorder of bone density and structure, unspecified: Secondary | ICD-10-CM | POA: Diagnosis not present

## 2018-01-25 DIAGNOSIS — H43391 Other vitreous opacities, right eye: Secondary | ICD-10-CM | POA: Diagnosis not present

## 2018-01-30 ENCOUNTER — Other Ambulatory Visit: Payer: Self-pay

## 2018-01-30 ENCOUNTER — Ambulatory Visit (AMBULATORY_SURGERY_CENTER): Payer: Self-pay

## 2018-01-30 VITALS — Ht 68.0 in | Wt 186.2 lb

## 2018-01-30 DIAGNOSIS — Z86010 Personal history of colonic polyps: Secondary | ICD-10-CM

## 2018-01-30 MED ORDER — NA SULFATE-K SULFATE-MG SULF 17.5-3.13-1.6 GM/177ML PO SOLN: 1.0000 | Freq: Once | ORAL | 0 refills | Status: AC

## 2018-01-30 NOTE — Progress Notes (Signed)
Denies allergies to eggs or soy products. Denies complication of anesthesia or sedation. Denies use of weight loss medication. Denies use of O2.   Emmi instructions declined.  

## 2018-02-13 ENCOUNTER — Encounter: Payer: Self-pay | Admitting: Gastroenterology

## 2018-02-13 ENCOUNTER — Ambulatory Visit (AMBULATORY_SURGERY_CENTER): Payer: Medicare Other | Admitting: Gastroenterology

## 2018-02-13 VITALS — BP 120/57 | HR 80 | Temp 99.8°F | Resp 10 | Ht 69.0 in | Wt 193.0 lb

## 2018-02-13 DIAGNOSIS — D122 Benign neoplasm of ascending colon: Secondary | ICD-10-CM | POA: Diagnosis not present

## 2018-02-13 DIAGNOSIS — D125 Benign neoplasm of sigmoid colon: Secondary | ICD-10-CM

## 2018-02-13 DIAGNOSIS — Z860100 Personal history of colon polyps, unspecified: Secondary | ICD-10-CM

## 2018-02-13 DIAGNOSIS — K635 Polyp of colon: Secondary | ICD-10-CM

## 2018-02-13 DIAGNOSIS — I10 Essential (primary) hypertension: Secondary | ICD-10-CM | POA: Diagnosis not present

## 2018-02-13 MED ORDER — SODIUM CHLORIDE 0.9 % IV SOLN: 500.0000 mL | Freq: Once | INTRAVENOUS | Status: DC

## 2018-02-13 NOTE — Progress Notes (Signed)
Pt's states no medical or surgical changes since previsit or office visit. 

## 2018-02-13 NOTE — Progress Notes (Signed)
Called to room to assist during endoscopic procedure.  Patient ID and intended procedure confirmed with present staff. Received instructions for my participation in the procedure from the performing physician.  

## 2018-02-13 NOTE — Patient Instructions (Signed)
Thank you for allowing [place] to care for you today!  Await pathology results by mail, approximately 2 weeks.  Repeat colonoscopy in 3 years.  Resume current diet and medications.     YOU HAD AN ENDOSCOPIC PROCEDURE TODAY AT [facility]:   Refer to the procedure report that was given to you for any specific questions about what was found during the examination.  If the procedure report does not answer your questions, please call your gastroenterologist to clarify.  If you requested that your care partner not be given the details of your procedure findings, then the procedure report has been included in a sealed envelope for you to review at your convenience later.  YOU SHOULD EXPECT: Some feelings of bloating in the abdomen. Passage of more gas than usual.  Walking can help get rid of the air that was put into your GI tract during the procedure and reduce the bloating. If you had a lower endoscopy (such as a colonoscopy or flexible sigmoidoscopy) you may notice spotting of blood in your stool or on the toilet paper. If you underwent a bowel prep for your procedure, you may not have a normal bowel movement for a few days.  Please Note:  You might notice some irritation and congestion in your nose or some drainage.  This is from the oxygen used during your procedure.  There is no need for concern and it should clear up in a day or so.  SYMPTOMS TO REPORT IMMEDIATELY:   Following lower endoscopy (colonoscopy or flexible sigmoidoscopy):  Excessive amounts of blood in the stool  Significant tenderness or worsening of abdominal pains  Swelling of the abdomen that is new, acute  Fever of 100F or higher    For urgent or emergent issues, a gastroenterologist can be reached at any hour by calling [phone].   DIET:  We do recommend a small meal at first, but then you may proceed to your regular diet.  Drink plenty of fluids but you should avoid alcoholic beverages for 24  hours.  ACTIVITY:  You should plan to take it easy for the rest of today and you should NOT DRIVE or use heavy machinery until tomorrow (because of the sedation medicines used during the test).    FOLLOW UP: Our staff will call the number listed on your records the next business day following your procedure to check on you and address any questions or concerns that you may have regarding the information given to you following your procedure. If we do not reach you, we will leave a message.  However, if you are feeling well and you are not experiencing any problems, there is no need to return our call.  We will assume that you have returned to your regular daily activities without incident.  If any biopsies were taken you will be contacted by phone or by letter within the next 1-3 weeks.  Please call us at [phone] if you have not heard about the biopsies in 3 weeks.    SIGNATURES/CONFIDENTIALITY: You and/or your care partner have signed paperwork which will be entered into your electronic medical record.  These signatures attest to the fact that that the information above on your After Visit Summary has been reviewed and is understood.  Full responsibility of the confidentiality of this discharge information lies with you and/or your care-partner.

## 2018-02-13 NOTE — Op Note (Signed)
[name] Procedure Date: 02/13/2018 2:52 PM MRN: [id number] Endoscopist: Remo Lipps P. Havery Moros , MD Age: 72 Referring MD:  Date of Birth: [birth year] Gender: Male Account #: [id number] Procedure:                Colonoscopy Indications:              High risk colon cancer surveillance: Personal                            history of multiple (11) adenomas removed one year                            ago Medicines:                Monitored Anesthesia Care Procedure:                Pre-Anesthesia Assessment:                           - Prior to the procedure, a History and Physical                            was performed, and patient medications and                            allergies were reviewed. The patient's tolerance of                            previous anesthesia was also reviewed. The risks                            and benefits of the procedure and the sedation                            options and risks were discussed with the patient.                            All questions were answered, and informed consent                            was obtained. Prior Anticoagulants: The patient has                            taken no previous anticoagulant or antiplatelet                            agents. ASA Grade Assessment: II - A patient with                            mild systemic disease. After reviewing the risks                            and benefits, the patient was deemed in  satisfactory condition to undergo the procedure.                           After obtaining informed consent, the colonoscope                            was passed under direct vision. Throughout the                            procedure, the patient's blood pressure, pulse, and                            oxygen saturations were monitored continuously. The                            Colonoscope was introduced through the anus and                     advanced to the the cecum, identified by                            appendiceal orifice and ileocecal valve. The                            colonoscopy was performed without difficulty. The                            patient tolerated the procedure well. The quality                            of the bowel preparation was adequate. The                            ileocecal valve, appendiceal orifice, and rectum                            were photographed. Scope In: 3:00:50 PM Scope Out: 3:24:10 PM Scope Withdrawal Time: 0 hours 17 minutes 5 seconds  Total Procedure Duration: 0 hours 23 minutes 20 seconds  Findings:                 The perianal and digital rectal examinations were                            normal.                           A 4 mm polyp was found in the ascending colon. The                            polyp was sessile. The polyp was removed with a                            cold snare. Resection and retrieval were complete.  A 4 mm polyp was found in the sigmoid colon. The                            polyp was sessile. The polyp was removed with a                            cold snare. Resection and retrieval were complete.                           Multiple medium-mouthed diverticula were found in                            the left colon and right colon.                           There was residual stool noted in the colon, worst                            in the cecum, time taken to lavage the colon to                            achieve adequate views. The exam was otherwise                            without abnormality. Complications:            No immediate complications. Estimated blood loss:                            Minimal. Estimated Blood Loss:     Estimated blood loss was minimal. Impression:               - One 4 mm polyp in the ascending colon, removed                            with a cold snare. Resected and  retrieved.                           - One 4 mm polyp in the sigmoid colon, removed with                            a cold snare. Resected and retrieved.                           - Diverticulosis in the left colon and in the right                            colon.                           - The examination was otherwise normal. Recommendation:           - Patient has a contact number available for  emergencies. The signs and symptoms of potential                            delayed complications were discussed with the                            patient. Return to normal activities tomorrow.                            Written discharge instructions were provided to the                            patient.                           - Resume previous diet.                           - Continue present medications.                           - Await pathology results.                           - Repeat colonoscopy in 3 years for surveillance                            given history of 11 adenomas removed one year ago [place]. Havery Moros, MD 02/13/2018 3:28:05 PM This report has been signed electronically.

## 2018-02-13 NOTE — Progress Notes (Signed)
Report given to PACU, vss 

## 2018-02-18 ENCOUNTER — Telehealth: Payer: Self-pay

## 2018-02-18 NOTE — Telephone Encounter (Signed)
Left message to call with any questions or concerns

## 2018-02-18 NOTE — Telephone Encounter (Signed)
Called [phone] and left a messaged we tried to reach pt for a follow up call. maw

## 2018-02-19 ENCOUNTER — Encounter: Payer: Self-pay | Admitting: Gastroenterology

## 2018-02-25 DIAGNOSIS — H43391 Other vitreous opacities, right eye: Secondary | ICD-10-CM | POA: Diagnosis not present

## 2018-04-19 DIAGNOSIS — Z23 Encounter for immunization: Secondary | ICD-10-CM | POA: Diagnosis not present

## 2018-04-29 DIAGNOSIS — M47812 Spondylosis without myelopathy or radiculopathy, cervical region: Secondary | ICD-10-CM | POA: Diagnosis not present

## 2018-05-13 DIAGNOSIS — I10 Essential (primary) hypertension: Secondary | ICD-10-CM | POA: Diagnosis not present

## 2018-05-13 DIAGNOSIS — E876 Hypokalemia: Secondary | ICD-10-CM | POA: Diagnosis not present

## 2018-05-20 DIAGNOSIS — L821 Other seborrheic keratosis: Secondary | ICD-10-CM | POA: Diagnosis not present

## 2018-06-09 DIAGNOSIS — M47812 Spondylosis without myelopathy or radiculopathy, cervical region: Secondary | ICD-10-CM | POA: Diagnosis not present

## 2018-06-09 DIAGNOSIS — Z6827 Body mass index (BMI) 27.0-27.9, adult: Secondary | ICD-10-CM | POA: Diagnosis not present

## 2018-06-09 DIAGNOSIS — I10 Essential (primary) hypertension: Secondary | ICD-10-CM | POA: Diagnosis not present

## 2018-06-09 DIAGNOSIS — M545 Low back pain: Secondary | ICD-10-CM | POA: Diagnosis not present

## 2018-06-09 DIAGNOSIS — M542 Cervicalgia: Secondary | ICD-10-CM | POA: Diagnosis not present

## 2018-08-05 DIAGNOSIS — M1711 Unilateral primary osteoarthritis, right knee: Secondary | ICD-10-CM | POA: Diagnosis not present

## 2018-08-05 DIAGNOSIS — M25561 Pain in right knee: Secondary | ICD-10-CM | POA: Diagnosis not present

## 2018-08-05 DIAGNOSIS — Z471 Aftercare following joint replacement surgery: Secondary | ICD-10-CM | POA: Diagnosis not present

## 2018-08-05 DIAGNOSIS — Z96652 Presence of left artificial knee joint: Secondary | ICD-10-CM | POA: Diagnosis not present

## 2018-10-08 DIAGNOSIS — D1801 Hemangioma of skin and subcutaneous tissue: Secondary | ICD-10-CM | POA: Diagnosis not present

## 2018-10-08 DIAGNOSIS — L570 Actinic keratosis: Secondary | ICD-10-CM | POA: Diagnosis not present

## 2018-12-11 DIAGNOSIS — D2261 Melanocytic nevi of right upper limb, including shoulder: Secondary | ICD-10-CM | POA: Diagnosis not present

## 2018-12-11 DIAGNOSIS — L918 Other hypertrophic disorders of the skin: Secondary | ICD-10-CM | POA: Diagnosis not present

## 2018-12-11 DIAGNOSIS — D225 Melanocytic nevi of trunk: Secondary | ICD-10-CM | POA: Diagnosis not present

## 2018-12-11 DIAGNOSIS — L570 Actinic keratosis: Secondary | ICD-10-CM | POA: Diagnosis not present

## 2018-12-11 DIAGNOSIS — D1801 Hemangioma of skin and subcutaneous tissue: Secondary | ICD-10-CM | POA: Diagnosis not present

## 2018-12-11 DIAGNOSIS — Z85820 Personal history of malignant melanoma of skin: Secondary | ICD-10-CM | POA: Diagnosis not present

## 2018-12-11 DIAGNOSIS — D692 Other nonthrombocytopenic purpura: Secondary | ICD-10-CM | POA: Diagnosis not present

## 2018-12-11 DIAGNOSIS — L821 Other seborrheic keratosis: Secondary | ICD-10-CM | POA: Diagnosis not present

## 2018-12-11 DIAGNOSIS — D2262 Melanocytic nevi of left upper limb, including shoulder: Secondary | ICD-10-CM | POA: Diagnosis not present

## 2018-12-30 DIAGNOSIS — M47812 Spondylosis without myelopathy or radiculopathy, cervical region: Secondary | ICD-10-CM | POA: Diagnosis not present

## 2019-01-08 DIAGNOSIS — R7301 Impaired fasting glucose: Secondary | ICD-10-CM | POA: Diagnosis not present

## 2019-01-08 DIAGNOSIS — M859 Disorder of bone density and structure, unspecified: Secondary | ICD-10-CM | POA: Diagnosis not present

## 2019-01-08 DIAGNOSIS — E7849 Other hyperlipidemia: Secondary | ICD-10-CM | POA: Diagnosis not present

## 2019-01-08 DIAGNOSIS — I10 Essential (primary) hypertension: Secondary | ICD-10-CM | POA: Diagnosis not present

## 2019-01-14 DIAGNOSIS — R82998 Other abnormal findings in urine: Secondary | ICD-10-CM | POA: Diagnosis not present

## 2019-01-14 DIAGNOSIS — I10 Essential (primary) hypertension: Secondary | ICD-10-CM | POA: Diagnosis not present

## 2019-01-28 DIAGNOSIS — D126 Benign neoplasm of colon, unspecified: Secondary | ICD-10-CM | POA: Diagnosis not present

## 2019-01-28 DIAGNOSIS — C439 Malignant melanoma of skin, unspecified: Secondary | ICD-10-CM | POA: Diagnosis not present

## 2019-01-28 DIAGNOSIS — C61 Malignant neoplasm of prostate: Secondary | ICD-10-CM | POA: Diagnosis not present

## 2019-01-28 DIAGNOSIS — J61 Pneumoconiosis due to asbestos and other mineral fibers: Secondary | ICD-10-CM | POA: Diagnosis not present

## 2019-01-28 DIAGNOSIS — M25569 Pain in unspecified knee: Secondary | ICD-10-CM | POA: Diagnosis not present

## 2019-01-28 DIAGNOSIS — E785 Hyperlipidemia, unspecified: Secondary | ICD-10-CM | POA: Diagnosis not present

## 2019-01-28 DIAGNOSIS — M5030 Other cervical disc degeneration, unspecified cervical region: Secondary | ICD-10-CM | POA: Diagnosis not present

## 2019-01-28 DIAGNOSIS — E663 Overweight: Secondary | ICD-10-CM | POA: Diagnosis not present

## 2019-01-28 DIAGNOSIS — Z1331 Encounter for screening for depression: Secondary | ICD-10-CM | POA: Diagnosis not present

## 2019-01-28 DIAGNOSIS — E876 Hypokalemia: Secondary | ICD-10-CM | POA: Diagnosis not present

## 2019-01-28 DIAGNOSIS — Z0000 Encounter for general adult medical examination without abnormal findings: Secondary | ICD-10-CM | POA: Diagnosis not present

## 2019-01-28 DIAGNOSIS — Z79899 Other long term (current) drug therapy: Secondary | ICD-10-CM | POA: Diagnosis not present

## 2019-01-28 DIAGNOSIS — M8580 Other specified disorders of bone density and structure, unspecified site: Secondary | ICD-10-CM | POA: Diagnosis not present

## 2019-01-29 ENCOUNTER — Telehealth: Payer: Self-pay | Admitting: Emergency Medicine

## 2019-01-29 DIAGNOSIS — J920 Pleural plaque with presence of asbestos: Secondary | ICD-10-CM

## 2019-01-29 DIAGNOSIS — J849 Interstitial pulmonary disease, unspecified: Secondary | ICD-10-CM

## 2019-01-29 NOTE — Telephone Encounter (Signed)
Left message for patient to call back  

## 2019-01-30 NOTE — Telephone Encounter (Signed)
Spoke with the pt  He states he is overdue to have f/u CT Chest and wants to get this ordered  Last CT was a HRCT 05/02/17  Please advise thanks

## 2019-02-02 DIAGNOSIS — M47812 Spondylosis without myelopathy or radiculopathy, cervical region: Secondary | ICD-10-CM | POA: Diagnosis not present

## 2019-02-02 DIAGNOSIS — M542 Cervicalgia: Secondary | ICD-10-CM | POA: Diagnosis not present

## 2019-02-02 NOTE — Telephone Encounter (Signed)
He needs a High Res CT chest for pleural plaques and interstitial lung disease, no contrast. Then he needs an OV with me to review.

## 2019-02-02 NOTE — Telephone Encounter (Signed)
RB is in clinic today, 7/13 so message should be able to be addressed today in regards if pt needs to have a CT ordered.

## 2019-02-03 NOTE — Telephone Encounter (Signed)
Order placed for the HRCT. Made a comment in the order that pt will need to be scheduled an OV with RB once the scan has been scheduled so that way RB will be able to go over results of scan with pt.  Attempted to call pt letting him know that we were going to order the scan but unable to reach pt. Per pt's DPR, okay to leave detailed message on machine. Left pt a detailed message that we were going to order the HRCT and that he would need an OV with RB after CT to go over results. Nothing further needed.

## 2019-02-05 DIAGNOSIS — M47812 Spondylosis without myelopathy or radiculopathy, cervical region: Secondary | ICD-10-CM | POA: Diagnosis not present

## 2019-02-05 DIAGNOSIS — M542 Cervicalgia: Secondary | ICD-10-CM | POA: Diagnosis not present

## 2019-02-27 DIAGNOSIS — M1711 Unilateral primary osteoarthritis, right knee: Secondary | ICD-10-CM | POA: Diagnosis not present

## 2019-03-03 DIAGNOSIS — M25561 Pain in right knee: Secondary | ICD-10-CM | POA: Diagnosis not present

## 2019-03-06 ENCOUNTER — Other Ambulatory Visit: Payer: Self-pay

## 2019-03-06 ENCOUNTER — Ambulatory Visit (INDEPENDENT_AMBULATORY_CARE_PROVIDER_SITE_OTHER)
Admission: RE | Admit: 2019-03-06 | Discharge: 2019-03-06 | Disposition: A | Discharge status: Home / Self Care | Payer: Medicare Other | Source: Ambulatory Visit | Attending: Emergency Medicine | Admitting: Emergency Medicine

## 2019-03-06 DIAGNOSIS — J849 Interstitial pulmonary disease, unspecified: Secondary | ICD-10-CM

## 2019-03-06 DIAGNOSIS — R911 Solitary pulmonary nodule: Secondary | ICD-10-CM | POA: Diagnosis not present

## 2019-03-06 DIAGNOSIS — J920 Pleural plaque with presence of asbestos: Secondary | ICD-10-CM

## 2019-03-06 DIAGNOSIS — J984 Other disorders of lung: Secondary | ICD-10-CM | POA: Diagnosis not present

## 2019-03-09 ENCOUNTER — Other Ambulatory Visit: Payer: Self-pay

## 2019-03-09 ENCOUNTER — Encounter: Payer: Self-pay | Admitting: Emergency Medicine

## 2019-03-09 ENCOUNTER — Ambulatory Visit (INDEPENDENT_AMBULATORY_CARE_PROVIDER_SITE_OTHER): Payer: Medicare Other | Admitting: Emergency Medicine

## 2019-03-09 DIAGNOSIS — J920 Pleural plaque with presence of asbestos: Secondary | ICD-10-CM

## 2019-03-09 NOTE — Patient Instructions (Signed)
CT scan in 12 months and a follow-up appointment with Dr. Lamonte Sakai after this results. This is indicated for routine surveillance of the pleural calcifications resulting from asbestos exposure.    Thank you for your visit today. It was a pleasure meeting with you. We will see you in a year.

## 2019-03-09 NOTE — Assessment & Plan Note (Addendum)
Pleural plaques/calcifications stable as per CT 03/06/2019. 37mm nodule unchanged. We personally read and reviewed the CT imaging. Given the degree of stability and absence of symptoms, annual screening is most efficacious to monitor for development of mesothelioma. The patient is in agreement with annual high resolution CT scan and follow-up with Dr. Lamonte Sakai thereafter.

## 2019-03-09 NOTE — Progress Notes (Signed)
Subjective:    Patient ID: Donald [name], male    DOB: 1946-02-13, 73 y.o.   MRN: [id number]  HPI 73 year old former smoker (10 + pack years) with a history of hypertension, melanoma, prostate cancer, hyperlipidemia. He also has history of asbestos exposure with pleural plaques that were reportedly first noted on chest x-rays from 2002. He began to have routine CXR's in 2015 to follow. His most recent CXR occurred after a fall and demonstrated suspected progression of plaque and calcium prompting investigation with high resolution CT chest in October 2018. He presents today for discussion of his repeat CT chest from 03/06/2019.    He states that his breathing is doing well. Walks comfortably for 3-5 miles without limitations from dyspnea. Continue to play golf as permitted by COVID limitations. No cough. No wheezing.  ROV 05/10/17 -- This follow-up visit for patient with a history of pleural plaques in the setting of asbestos exposure that date back to 2002. He underwent pulmonary function testing today that I have personally reviewed. This showed normal airflows without a bronchodilator response, normal lung volumes, slightly decreased diffusion capacity that corrects to the normal range when adjusted for his alveolar volume. Flow volume loops were normal. I reviewed his CT chest from 05/02/17, shows calcified and non calcified plaques, no significant parenchymal disease.   Review of Systems  Constitutional: Negative for fever and unexpected weight change.  HENT: Negative for congestion, dental problem, ear pain, nosebleeds, postnasal drip, rhinorrhea, sinus pressure, sneezing, sore throat and trouble swallowing.   Eyes: Negative for redness and itching.  Respiratory: Negative for cough, chest tightness, shortness of breath and wheezing.   Cardiovascular: Negative for palpitations and leg swelling.  Gastrointestinal: Negative for nausea and vomiting.  Genitourinary: Negative for dysuria.   Musculoskeletal: Negative for joint swelling.  Skin: Negative for rash.  Neurological: Negative for headaches.  Hematological: Does not bruise/bleed easily.  Psychiatric/Behavioral: Negative for dysphoric mood. The patient is not nervous/anxious.     Past Medical History:  Diagnosis Date  . Allergy   . Arthritis    thumbs, neck,  knees  . Depression   . Diverticulitis   . Diverticulosis   . Hyperlipidemia   . Hypertension   . Prostate cancer ([place])    seed implant  . Tubular adenoma of colon      Family History  Problem Relation Age of Onset  . Heart disease Mother   . Heart disease Father   . Prostate cancer Brother   . Lung cancer Brother   . Colon cancer Neg Hx   . Colon polyps Neg Hx   . Esophageal cancer Neg Hx   . Rectal cancer Neg Hx   . Stomach cancer Neg Hx   . Liver cancer Neg Hx   . Pancreatic cancer Neg Hx      Social History   Socioeconomic History  . Marital status: Married    Spouse name: Not on file  . Number of children: 2  . Years of education: Not on file  . Highest education level: Not on file  Occupational History  . Occupation: retired  Scientific laboratory technician  . Financial resource strain: Not on file  . Food insecurity    Worry: Not on file    Inability: Not on file  . Transportation needs    Medical: Not on file    Non-medical: Not on file  Tobacco Use  . Smoking status: Former Smoker    Packs/day: 0.50    Years:  25.00    Pack years: 12.50    Types: Cigarettes    Quit date: 11/27/1978    Years since quitting: 40.3  . Smokeless tobacco: Former Systems developer    Types: Chew  Substance and Sexual Activity  . Alcohol use: Yes    Comment: social- 2 drinks a month maybe   . Drug use: No  . Sexual activity: Not on file  Lifestyle  . Physical activity    Days per week: Not on file    Minutes per session: Not on file  . Stress: Not on file  Relationships  . Social Herbalist on phone: Not on file    Gets together: Not on file    Attends  religious service: Not on file    Active member of club or organization: Not on file    Attends meetings of clubs or organizations: Not on file    Relationship status: Not on file  . Intimate partner violence    Fear of current or ex partner: Not on file    Emotionally abused: Not on file    Physically abused: Not on file    Forced sexual activity: Not on file  Other Topics Concern  . Not on file  Social History Narrative  . 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  No pets or hobbies that give inhaled exposure  No Known Allergies   Outpatient Medications Prior to Visit  Medication Sig Dispense Refill  . amLODipine (NORVASC) 10 MG tablet Take 10 mg by mouth daily.    Marland Kitchen atorvastatin (LIPITOR) 10 MG tablet Take 10 mg by mouth daily.    . benazepril (LOTENSIN) 20 MG tablet Take 20 mg by mouth daily.    Marland Kitchen Clay probiotic. One capsule daily.    . Tamsulosin HCl (FLOMAX) 0.4 MG CAPS Take by mouth 2 (two) times daily.    Marland Kitchen triamterene-hydrochlorothiazide (MAXZIDE-25) 37.5-25 MG tablet Take 1 tablet by mouth daily.    Marland Kitchen venlafaxine XR (EFFEXOR-XR) 150 MG 24 hr capsule Take 150 mg by mouth daily.    Marland Kitchen zolpidem (AMBIEN) 5 MG tablet Take 5 mg by mouth daily.     Facility-Administered Medications Prior to Visit  Medication Dose Route Frequency Provider Last Rate Last Dose  . 0.9 %  sodium chloride infusion  500 mL Intravenous Once Armbruster, Carlota Raspberry, MD            Objective:   Physical Exam Vitals:   03/09/19 1349  BP: 124/68  Pulse: (!) 103  Temp: 98.1 F (36.7 C)  TempSrc: Oral  SpO2: 93%  Weight: 191 lb 3.2 oz (86.7 kg)  Height: 5\' 9"  (1.753 m)   General: A/O x4, in no acute distress, afebrile, nondiaphoretic HEENT: PEERL, EMO intact Cardio: RRR, no mrg's, no JVD's Pulmonary: CTA bilaterally,  no wheezing or  crackles  Abdomen: Bowel sounds normal, soft, nontender  MSK: BLE nontender, nonedematous Neuro: Alert, CNII-XII grossly intact, conversational, strength 5/5 in the upper and lower extremities bilaterally, normal gait Psych: Appropriate affect, not depressed in appearance, engages well     Assessment & Plan:  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 46mm nodule unchanged. We personally read and reviewed the CT imaging. Given the degree of stability and absence  of symptoms, annual screening is most efficacious to monitor for development of mesothelioma. The patient is in agreement with annual high resolution CT scan and follow-up with Dr. Lamonte Sakai thereafter.   Kathi Ludwig, MD, PGY-3 03/09/2019, 2:17 PM    Attending Note:  I have examined patient, reviewed labs, studies and notes. I agree with the data and plans as detailed above.   Mr. Stoffers is 75 with a history of asbestos exposure.  He has pleural plaques and pleural thickening on CT scan of the chest which would been following.  His most recent was 03/06/2019 which I personally reviewed.  His pleural disease and plaques are stable.  He is asymptomatic and has good exercise tolerance.  We will plan to repeat a CT scan of the chest in 1 year to assess for interval change.  We talked today about the low but real risk of transformation to mesothelioma.  Patient understands.   Baltazar Apo, MD, PhD 03/12/2019, 4:54 PM [facility] [phone] or if no answer [phone]

## 2019-03-12 DIAGNOSIS — M542 Cervicalgia: Secondary | ICD-10-CM | POA: Diagnosis not present

## 2019-03-31 DIAGNOSIS — M8580 Other specified disorders of bone density and structure, unspecified site: Secondary | ICD-10-CM | POA: Diagnosis not present

## 2019-03-31 DIAGNOSIS — M859 Disorder of bone density and structure, unspecified: Secondary | ICD-10-CM | POA: Diagnosis not present

## 2019-03-31 DIAGNOSIS — C61 Malignant neoplasm of prostate: Secondary | ICD-10-CM | POA: Diagnosis not present

## 2019-04-07 DIAGNOSIS — Z6827 Body mass index (BMI) 27.0-27.9, adult: Secondary | ICD-10-CM | POA: Diagnosis not present

## 2019-04-07 DIAGNOSIS — M47812 Spondylosis without myelopathy or radiculopathy, cervical region: Secondary | ICD-10-CM | POA: Diagnosis not present

## 2019-04-07 DIAGNOSIS — I10 Essential (primary) hypertension: Secondary | ICD-10-CM | POA: Diagnosis not present

## 2019-05-02 DIAGNOSIS — Z23 Encounter for immunization: Secondary | ICD-10-CM | POA: Diagnosis not present

## 2019-05-14 DIAGNOSIS — M47812 Spondylosis without myelopathy or radiculopathy, cervical region: Secondary | ICD-10-CM | POA: Diagnosis not present

## 2019-05-28 DIAGNOSIS — Z6826 Body mass index (BMI) 26.0-26.9, adult: Secondary | ICD-10-CM | POA: Diagnosis not present

## 2019-05-28 DIAGNOSIS — M47812 Spondylosis without myelopathy or radiculopathy, cervical region: Secondary | ICD-10-CM | POA: Diagnosis not present

## 2019-06-01 DIAGNOSIS — M47812 Spondylosis without myelopathy or radiculopathy, cervical region: Secondary | ICD-10-CM | POA: Diagnosis not present

## 2019-06-15 DIAGNOSIS — M47812 Spondylosis without myelopathy or radiculopathy, cervical region: Secondary | ICD-10-CM | POA: Diagnosis not present

## 2019-07-28 DIAGNOSIS — M47812 Spondylosis without myelopathy or radiculopathy, cervical region: Secondary | ICD-10-CM | POA: Diagnosis not present

## 2019-09-01 DIAGNOSIS — M542 Cervicalgia: Secondary | ICD-10-CM | POA: Diagnosis not present

## 2019-09-01 DIAGNOSIS — M47812 Spondylosis without myelopathy or radiculopathy, cervical region: Secondary | ICD-10-CM | POA: Diagnosis not present

## 2019-12-11 DIAGNOSIS — D2261 Melanocytic nevi of right upper limb, including shoulder: Secondary | ICD-10-CM | POA: Diagnosis not present

## 2019-12-11 DIAGNOSIS — L821 Other seborrheic keratosis: Secondary | ICD-10-CM | POA: Diagnosis not present

## 2019-12-11 DIAGNOSIS — D2262 Melanocytic nevi of left upper limb, including shoulder: Secondary | ICD-10-CM | POA: Diagnosis not present

## 2019-12-11 DIAGNOSIS — D225 Melanocytic nevi of trunk: Secondary | ICD-10-CM | POA: Diagnosis not present

## 2019-12-11 DIAGNOSIS — Z85820 Personal history of malignant melanoma of skin: Secondary | ICD-10-CM | POA: Diagnosis not present

## 2019-12-11 DIAGNOSIS — D1801 Hemangioma of skin and subcutaneous tissue: Secondary | ICD-10-CM | POA: Diagnosis not present

## 2019-12-11 DIAGNOSIS — L918 Other hypertrophic disorders of the skin: Secondary | ICD-10-CM | POA: Diagnosis not present

## 2019-12-11 DIAGNOSIS — D485 Neoplasm of uncertain behavior of skin: Secondary | ICD-10-CM | POA: Diagnosis not present

## 2019-12-11 DIAGNOSIS — L570 Actinic keratosis: Secondary | ICD-10-CM | POA: Diagnosis not present

## 2020-02-18 DIAGNOSIS — E559 Vitamin D deficiency, unspecified: Secondary | ICD-10-CM | POA: Diagnosis not present

## 2020-02-18 DIAGNOSIS — R7301 Impaired fasting glucose: Secondary | ICD-10-CM | POA: Diagnosis not present

## 2020-02-18 DIAGNOSIS — E7849 Other hyperlipidemia: Secondary | ICD-10-CM | POA: Diagnosis not present

## 2020-02-18 DIAGNOSIS — Z125 Encounter for screening for malignant neoplasm of prostate: Secondary | ICD-10-CM | POA: Diagnosis not present

## 2020-02-24 ENCOUNTER — Ambulatory Visit
Admission: RE | Admit: 2020-02-24 | Discharge: 2020-02-24 | Disposition: A | Discharge status: Home / Self Care | Payer: Medicare Other | Source: Ambulatory Visit | Attending: Emergency Medicine | Admitting: Emergency Medicine

## 2020-02-24 ENCOUNTER — Other Ambulatory Visit: Payer: Self-pay

## 2020-02-24 DIAGNOSIS — Z0000 Encounter for general adult medical examination without abnormal findings: Secondary | ICD-10-CM | POA: Diagnosis not present

## 2020-02-24 DIAGNOSIS — J920 Pleural plaque with presence of asbestos: Secondary | ICD-10-CM

## 2020-02-24 DIAGNOSIS — N529 Male erectile dysfunction, unspecified: Secondary | ICD-10-CM | POA: Diagnosis not present

## 2020-02-24 DIAGNOSIS — K449 Diaphragmatic hernia without obstruction or gangrene: Secondary | ICD-10-CM | POA: Diagnosis not present

## 2020-02-24 DIAGNOSIS — I2510 Atherosclerotic heart disease of native coronary artery without angina pectoris: Secondary | ICD-10-CM | POA: Diagnosis not present

## 2020-02-24 DIAGNOSIS — R82998 Other abnormal findings in urine: Secondary | ICD-10-CM | POA: Diagnosis not present

## 2020-02-24 DIAGNOSIS — M1990 Unspecified osteoarthritis, unspecified site: Secondary | ICD-10-CM | POA: Diagnosis not present

## 2020-02-24 DIAGNOSIS — F329 Major depressive disorder, single episode, unspecified: Secondary | ICD-10-CM | POA: Diagnosis not present

## 2020-02-24 DIAGNOSIS — M8580 Other specified disorders of bone density and structure, unspecified site: Secondary | ICD-10-CM | POA: Diagnosis not present

## 2020-02-24 DIAGNOSIS — J929 Pleural plaque without asbestos: Secondary | ICD-10-CM | POA: Diagnosis not present

## 2020-02-24 DIAGNOSIS — J432 Centrilobular emphysema: Secondary | ICD-10-CM | POA: Diagnosis not present

## 2020-02-24 DIAGNOSIS — Z1331 Encounter for screening for depression: Secondary | ICD-10-CM | POA: Diagnosis not present

## 2020-02-24 DIAGNOSIS — I10 Essential (primary) hypertension: Secondary | ICD-10-CM | POA: Diagnosis not present

## 2020-02-24 DIAGNOSIS — Z1389 Encounter for screening for other disorder: Secondary | ICD-10-CM | POA: Diagnosis not present

## 2020-02-24 DIAGNOSIS — J61 Pneumoconiosis due to asbestos and other mineral fibers: Secondary | ICD-10-CM | POA: Diagnosis not present

## 2020-02-24 DIAGNOSIS — R7301 Impaired fasting glucose: Secondary | ICD-10-CM | POA: Diagnosis not present

## 2020-02-24 DIAGNOSIS — E559 Vitamin D deficiency, unspecified: Secondary | ICD-10-CM | POA: Diagnosis not present

## 2020-02-24 DIAGNOSIS — L821 Other seborrheic keratosis: Secondary | ICD-10-CM | POA: Diagnosis not present

## 2020-02-29 ENCOUNTER — Telehealth: Payer: Self-pay | Admitting: Emergency Medicine

## 2020-02-29 NOTE — Telephone Encounter (Signed)
Spoke with patient and let him know that Dr. Lamonte Sakai had not had a chance to review his CT scan from 02/24/20.  Advised that after he reviews the scan one of [place] will give him a call to provide the results.  He verbalized understanding.  Nothing further needed.

## 2020-02-29 NOTE — Telephone Encounter (Signed)
Called and spoke with patient ,  Patient is concerned with the reading of emphysema and wants to know what he should do about it.

## 2020-02-29 NOTE — Telephone Encounter (Signed)
Please let him know that his CT scan is stable compared with his prior.  This is good news.

## 2020-03-01 NOTE — Telephone Encounter (Signed)
Attempted to call pt but unable to reach. Left message for him to return call. °

## 2020-03-01 NOTE — Telephone Encounter (Signed)
Please let him know that I went back and looked at the scan again. Any evidence for emphysematous change is very subtle, mild, and may actually relate to some of his known asbestos changes.  Unless he is having SOB, I don't think he needs any other evaluation. If he is having symptoms then we can consider PFT now to evaluate further since he does have a small smoking history.

## 2020-03-02 NOTE — Telephone Encounter (Signed)
LMTCB x2 for pt 

## 2020-03-02 NOTE — Telephone Encounter (Signed)
Pt returned call. Stated to pt the further info from [place] about CT scan and pt verbalized understanding. Nothing further needed.

## 2020-03-26 DIAGNOSIS — L03011 Cellulitis of right finger: Secondary | ICD-10-CM | POA: Diagnosis not present

## 2020-04-27 DIAGNOSIS — Z23 Encounter for immunization: Secondary | ICD-10-CM | POA: Diagnosis not present

## 2020-08-16 ENCOUNTER — Other Ambulatory Visit: Payer: Self-pay

## 2020-08-16 ENCOUNTER — Other Ambulatory Visit: Payer: Medicare Other

## 2020-08-16 DIAGNOSIS — Z20822 Contact with and (suspected) exposure to covid-19: Secondary | ICD-10-CM

## 2020-08-17 LAB — NOVEL CORONAVIRUS, NAA: SARS-CoV-2, NAA: NOT DETECTED

## 2020-08-17 LAB — SARS-COV-2, NAA 2 DAY TAT

## 2020-09-30 DIAGNOSIS — H43391 Other vitreous opacities, right eye: Secondary | ICD-10-CM | POA: Diagnosis not present

## 2020-10-03 DIAGNOSIS — Z20822 Contact with and (suspected) exposure to covid-19: Secondary | ICD-10-CM | POA: Diagnosis not present

## 2020-11-08 DIAGNOSIS — H43391 Other vitreous opacities, right eye: Secondary | ICD-10-CM | POA: Diagnosis not present

## 2020-12-12 DIAGNOSIS — R059 Cough, unspecified: Secondary | ICD-10-CM | POA: Diagnosis not present

## 2020-12-12 DIAGNOSIS — J040 Acute laryngitis: Secondary | ICD-10-CM | POA: Diagnosis not present

## 2020-12-12 DIAGNOSIS — Z1152 Encounter for screening for COVID-19: Secondary | ICD-10-CM | POA: Diagnosis not present

## 2020-12-20 DIAGNOSIS — D2261 Melanocytic nevi of right upper limb, including shoulder: Secondary | ICD-10-CM | POA: Diagnosis not present

## 2020-12-20 DIAGNOSIS — L821 Other seborrheic keratosis: Secondary | ICD-10-CM | POA: Diagnosis not present

## 2020-12-20 DIAGNOSIS — Z85820 Personal history of malignant melanoma of skin: Secondary | ICD-10-CM | POA: Diagnosis not present

## 2020-12-20 DIAGNOSIS — Z85828 Personal history of other malignant neoplasm of skin: Secondary | ICD-10-CM | POA: Diagnosis not present

## 2020-12-20 DIAGNOSIS — L570 Actinic keratosis: Secondary | ICD-10-CM | POA: Diagnosis not present

## 2020-12-20 DIAGNOSIS — D485 Neoplasm of uncertain behavior of skin: Secondary | ICD-10-CM | POA: Diagnosis not present

## 2020-12-20 DIAGNOSIS — L814 Other melanin hyperpigmentation: Secondary | ICD-10-CM | POA: Diagnosis not present

## 2020-12-20 DIAGNOSIS — D225 Melanocytic nevi of trunk: Secondary | ICD-10-CM | POA: Diagnosis not present

## 2021-02-27 DIAGNOSIS — Z125 Encounter for screening for malignant neoplasm of prostate: Secondary | ICD-10-CM | POA: Diagnosis not present

## 2021-02-27 DIAGNOSIS — E559 Vitamin D deficiency, unspecified: Secondary | ICD-10-CM | POA: Diagnosis not present

## 2021-02-27 DIAGNOSIS — E785 Hyperlipidemia, unspecified: Secondary | ICD-10-CM | POA: Diagnosis not present

## 2021-02-27 DIAGNOSIS — R7301 Impaired fasting glucose: Secondary | ICD-10-CM | POA: Diagnosis not present

## 2021-02-27 DIAGNOSIS — M8589 Other specified disorders of bone density and structure, multiple sites: Secondary | ICD-10-CM | POA: Diagnosis not present

## 2021-03-01 DIAGNOSIS — M25561 Pain in right knee: Secondary | ICD-10-CM | POA: Diagnosis not present

## 2021-03-06 DIAGNOSIS — M5030 Other cervical disc degeneration, unspecified cervical region: Secondary | ICD-10-CM | POA: Diagnosis not present

## 2021-03-06 DIAGNOSIS — R82998 Other abnormal findings in urine: Secondary | ICD-10-CM | POA: Diagnosis not present

## 2021-03-06 DIAGNOSIS — R7301 Impaired fasting glucose: Secondary | ICD-10-CM | POA: Diagnosis not present

## 2021-03-06 DIAGNOSIS — Z23 Encounter for immunization: Secondary | ICD-10-CM | POA: Diagnosis not present

## 2021-03-06 DIAGNOSIS — M1990 Unspecified osteoarthritis, unspecified site: Secondary | ICD-10-CM | POA: Diagnosis not present

## 2021-03-06 DIAGNOSIS — L821 Other seborrheic keratosis: Secondary | ICD-10-CM | POA: Diagnosis not present

## 2021-03-06 DIAGNOSIS — J61 Pneumoconiosis due to asbestos and other mineral fibers: Secondary | ICD-10-CM | POA: Diagnosis not present

## 2021-03-06 DIAGNOSIS — M8580 Other specified disorders of bone density and structure, unspecified site: Secondary | ICD-10-CM | POA: Diagnosis not present

## 2021-03-06 DIAGNOSIS — Z79899 Other long term (current) drug therapy: Secondary | ICD-10-CM | POA: Diagnosis not present

## 2021-03-06 DIAGNOSIS — E785 Hyperlipidemia, unspecified: Secondary | ICD-10-CM | POA: Diagnosis not present

## 2021-03-06 DIAGNOSIS — C439 Malignant melanoma of skin, unspecified: Secondary | ICD-10-CM | POA: Diagnosis not present

## 2021-03-06 DIAGNOSIS — I10 Essential (primary) hypertension: Secondary | ICD-10-CM | POA: Diagnosis not present

## 2021-03-06 DIAGNOSIS — Z0000 Encounter for general adult medical examination without abnormal findings: Secondary | ICD-10-CM | POA: Diagnosis not present

## 2021-03-10 ENCOUNTER — Encounter: Payer: Self-pay | Admitting: Gastroenterology

## 2021-03-28 DIAGNOSIS — L821 Other seborrheic keratosis: Secondary | ICD-10-CM | POA: Diagnosis not present

## 2021-03-28 DIAGNOSIS — L905 Scar conditions and fibrosis of skin: Secondary | ICD-10-CM | POA: Diagnosis not present

## 2021-04-10 ENCOUNTER — Other Ambulatory Visit: Payer: Self-pay

## 2021-04-10 ENCOUNTER — Ambulatory Visit (AMBULATORY_SURGERY_CENTER): Payer: Medicare Other

## 2021-04-10 VITALS — Ht 69.0 in | Wt 193.0 lb

## 2021-04-10 DIAGNOSIS — Z86010 Personal history of colonic polyps: Secondary | ICD-10-CM

## 2021-04-10 MED ORDER — PEG 3350-KCL-NA BICARB-NACL 420 G PO SOLR: 4000.0000 mL | Freq: Once | ORAL | 0 refills | Status: AC

## 2021-04-10 NOTE — Progress Notes (Signed)
No egg or soy allergy known to patient  No issues known to pt with past sedation with any surgeries or procedures Patient denies ever being told they had issues or difficulty with intubation  No FH of Malignant Hyperthermia Pt is not on diet pills Pt is not on  home 02  Pt is not on blood thinners  Pt denies issues with constipation  No A fib or A flutter  EMMI video to pt or via [place] 19 guidelines implemented in PV today with Pt and RN   Pt is fully vaccinated  for [employer].  NO PA's for preps discussed with pt In PV today  Discussed with pt there will be an out-of-pocket cost for prep and that varies from $0 to 70 +  dollars   Due to the COVID-19 pandemic we are asking patients to follow certain guidelines.  Pt aware of COVID protocols and [facility] guidelines

## 2021-04-25 ENCOUNTER — Encounter: Payer: Medicare Other | Admitting: Gastroenterology

## 2021-06-30 DIAGNOSIS — M1711 Unilateral primary osteoarthritis, right knee: Secondary | ICD-10-CM | POA: Diagnosis not present

## 2021-07-06 DIAGNOSIS — Z23 Encounter for immunization: Secondary | ICD-10-CM | POA: Diagnosis not present

## 2021-10-25 NOTE — H&P (Signed)
TOTAL KNEE ADMISSION H&P ? ?Patient is being admitted for right total knee arthroplasty. ? ?Subjective: ? ?Chief Complaint: Right knee pain. ? ?HPI: Donald [name], 76 y.o. male has a history of pain and functional disability in the right knee due to arthritis and has failed non-surgical conservative treatments for greater than 12 weeks to include NSAID's and/or analgesics and activity modification. Onset of symptoms was gradual, starting  several  years ago with gradually worsening course since that time. The patient noted no past surgery on the right knee.  Patient currently rates pain in the right knee at 7 out of 10 with activity. Patient has worsening of pain with activity and weight bearing, pain that interferes with activities of daily living, pain with passive range of motion, and crepitus. Patient has evidence of periarticular osteophytes and joint space narrowing by imaging studies. There is no active infection. ? ?Patient Active Problem List  ? Diagnosis Date Noted  ? Calcified pleural plaque due to asbestos exposure 04/26/2017  ? Hypercholesteremia 04/25/2017  ? Depression 04/25/2017  ? Glaucoma 04/25/2017  ? Primary osteoarthritis of both first carpometacarpal joints 02/21/2016  ? ? ?Past Medical History:  ?Diagnosis Date  ? Allergy   ? Arthritis   ? thumbs, neck,  knees  ? Depression   ? Diverticulitis   ? Diverticulosis   ? Hyperlipidemia   ? Hypertension   ? Melanoma ([place])   ? Prostate cancer [facility]) 2009  ? seed implant  ? Tubular adenoma of colon   ? ? ?Past Surgical History:  ?Procedure Laterality Date  ? COLONOSCOPY    ? EYE SURGERY    ? HEMORRHOID SURGERY    ? JOINT REPLACEMENT Left   ? knee  ? MELANOMA EXCISION    ? scapula,face, and right calf  ? POLYPECTOMY    ? PROSTATE SURGERY  2009  ? seed implant  ? TOTAL KNEE ARTHROPLASTY Left   ? left knee in 2003 and 2007  ? ? ?Prior to Admission medications   ?Medication Sig Start Date End Date Taking? Authorizing Provider  ?amLODipine (NORVASC) 10 MG  tablet Take 10 mg by mouth daily.    [provider]  ?atorvastatin (LIPITOR) 10 MG tablet Take 10 mg by mouth daily.    [provider]  ?benazepril (LOTENSIN) 20 MG tablet Take 20 mg by mouth daily.    [provider]  ?[place] probiotic. One capsule daily. ?Patient not taking: Reported on 04/10/2021    [provider]  ?Tamsulosin HCl (FLOMAX) 0.4 MG CAPS Take by mouth 2 (two) times daily.    [provider]  ?triamterene-hydrochlorothiazide (MAXZIDE-25) 37.5-25 MG tablet Take 1 tablet by mouth daily.    [provider]  ?venlafaxine XR (EFFEXOR-XR) 150 MG 24 hr capsule Take 150 mg by mouth daily.    [provider]  ?zolpidem (AMBIEN) 5 MG tablet Take 5 mg by mouth daily.    [provider]  ? ? ?No Known Allergies ? ?Social History  ? ?Socioeconomic History  ? Marital status: Married  ?  Spouse name: Not on file  ? Number of children: 2  ? Years of education: Not on file  ? Highest education level: Not on file  ?Occupational History  ? Occupation: retired  ?Tobacco Use  ? Smoking status: Former  ?  Packs/day: 0.50  ?  Years: 15.00  ?  Pack years: 7.50  ?  Types: Cigarettes  ?  Quit date:  11/27/1978  ?  Years since quitting: 42.9  ? Smokeless tobacco: Former  ?  Types: Chew  ?  Quit date: 62  ?Vaping Use  ? Vaping Use: Never used  ?Substance and Sexual Activity  ? Alcohol use: Yes  ?  Comment: social- 2 drinks a month maybe   ? Drug use: Never  ? Sexual activity: Yes  ?Other Topics Concern  ? Not on file  ?Social History Narrative  ? Not on file  ? ?Social Determinants of Health  ? ?Financial Resource Strain: Not on file  ?Food Insecurity: Not on file  ?Transportation Needs: Not on file  ?Physical Activity: Not on file  ?Stress: Not on file  ?Social Connections: Not on file  ?Intimate Partner Violence: Not on file  ? ? ?Tobacco Use: Medium Risk  ? Smoking Tobacco Use: Former  ? Smokeless Tobacco Use:  Former  ? Passive Exposure: Not on file  ? ?Social History  ? ?Substance and Sexual Activity  ?Alcohol Use Yes  ? Comment: social- 2 drinks a month maybe   ? ? ?Family History  ?Problem Relation Age of Onset  ? Heart disease Mother   ? Heart disease Father   ? Prostate cancer Brother   ? Lung cancer Brother   ? Colon cancer Neg Hx   ? Colon polyps Neg Hx   ? Esophageal cancer Neg Hx   ? Rectal cancer Neg Hx   ? Stomach cancer Neg Hx   ? Liver cancer Neg Hx   ? Pancreatic cancer Neg Hx   ? ? ?ROS: ?Constitutional: no fever, no chills, no night sweats, no significant weight loss ?Cardiovascular: no chest pain, no palpitations ?Respiratory: no cough, no shortness of breath, No COPD ?Gastrointestinal: no vomiting, no nausea ?Musculoskeletal: no swelling in Joints, Joint Pain ?Neurologic: no numbness, no tingling, no difficulty with balance ? ? ?Objective: ? ?Physical Exam: ?Well nourished and well developed.  ?General: Alert and oriented x3, cooperative and pleasant, no acute distress.  ?Head: normocephalic, atraumatic, neck supple.  ?Eyes: EOMI.  ?Respiratory: breath sounds clear in all fields, no wheezing, rales, or rhonchi. ?Cardiovascular: Regular rate and rhythm, no murmurs, gallops or rubs.  ?Abdomen: non-tender to palpation and soft, normoactive bowel sounds. ?Musculoskeletal: ? ? Right Knee Exam: ? Trace effusion present. No swelling present. ? Slight valgus deformity. ? The range of motion is: 0 to 125 degrees. ? No crepitus on range of motion of the knee. ? Positive lateral greater than medial joint line tenderness. ? The knee is stable. ? ?Calves soft and nontender. Motor function intact in LE. Strength 5/5 LE bilaterally. ?Neuro: Distal pulses 2+. Sensation to light touch intact in LE. ? ? ?Vital signs in last 24 hours: ?BP: ()/()  ?Arterial Line BP: ()/()  ? ?Imaging Review ?AP and lateral of the right knee dated 02/2021 demonstrate bone-on-bone arthritis in the lateral and patellofemoral compartments of  the right knee with slight valgus. ? ?Assessment/Plan: ? ?End stage arthritis, right knee  ? ?The patient history, physical examination, clinical judgment of the provider and imaging studies are consistent with end stage degenerative joint disease of the right knee and total knee arthroplasty is deemed medically necessary. The treatment options including medical management, injection therapy arthroscopy and arthroplasty were discussed at length. The risks and benefits of total knee arthroplasty were presented and reviewed. The risks due to aseptic loosening, infection, stiffness, patella tracking problems, thromboembolic complications and other imponderables were discussed. The patient acknowledged the explanation, agreed to  proceed with the plan and consent was signed. Patient is being admitted for inpatient treatment for surgery, pain control, PT, OT, prophylactic antibiotics, VTE prophylaxis, progressive ambulation and ADLs and discharge planning. The patient is planning to be discharged  home . ? ? ?Patient's anticipated LOS is less than 2 midnights, meeting these requirements: ?- Younger than 6 ?- Lives within 1 hour of care ?- Has a competent adult at home to recover with post-op recover ?- NO history of ? - Chronic pain requiring opiods ? - Diabetes ? - Coronary Artery Disease ? - Heart failure ? - Heart attack ? - Stroke ? - DVT/VTE ? - Cardiac arrhythmia ? - Respiratory Failure/COPD ? - Renal failure ? - Anemia ? - Advanced Liver disease ? ? ? ?Therapy Plans: [facility] ?Disposition: Home with Wife Jan ?Planned DVT Prophylaxis: Xarelto '10mg'$  (Prostate CA/Melanoma) ?DME Needed: RW ?PCP: Crist Infante, Md (clearance received) ?TXA: IV ?Allergies: NKDA ?Anesthesia Concerns: None ?BMI: 28.8 ?Last HgbA1c: N/A ? ?Pharmacy:  [facility] ? ?- Patient was instructed on what medications to stop prior to surgery. ?- Follow-up visit in 2 weeks with Dr. Wynelle Link ?- Begin physical therapy following surgery ?-  Pre-operative lab work as pre-surgical testing ?- Prescriptions will be provided in hospital at time of discharge ? ?Fenton Foy, MBA, PA-C ?Orthopedic Surgery ?[facility] [place] ? ?  ?

## 2021-11-01 NOTE — Progress Notes (Addendum)
COVID Vaccine Completed: yes x4 ?Date COVID Vaccine completed: ?Has received booster: ?COVID vaccine manufacturer: [place]  ? ?Date of COVID positive in last 90 days: no ? ?PCP - Crist Infante, MD ?Cardiologist - n/a ? ?Medical clearance by Crist Infante 07/12/21 on chart ? ?Chest x-ray - n/a ?EKG - 11/02/21 Epic/chart ?Stress Test - yes 7 years ago per pt, with [facility]  ?ECHO - n/a ?Cardiac Cath - n/a ?Pacemaker/ICD device last checked: n/a ?Spinal Cord Stimulator: n/a ? ?Bowel Prep - no ? ?Sleep Study - yes, negative ?CPAP -  ? ?Fasting Blood Sugar - n/a ?Checks Blood Sugar _____ times a day ? ?Blood Thinner Instructions: n/a ?Aspirin Instructions: ?Last Dose: ? ?Activity level: Can go up a flight of stairs and perform activities of daily living without stopping and without symptoms of chest pain or shortness of breath. ?    ? ?Anesthesia review: small open wound to back of neck, smaller than a dime, healing well, PCP aware and recommending ointment and Band-Aid, HTN, 1st degree AV block on EKG ? ?Patient denies shortness of breath, fever, cough and chest pain at PAT appointment ? ? ?Patient verbalized understanding of instructions that were given to them at the PAT appointment. Patient was also instructed that they will need to review over the PAT instructions again at home before surgery.  ?

## 2021-11-01 NOTE — Patient Instructions (Addendum)
DUE TO COVID-19 ONLY ONE VISITOR  (aged 75 and older)  IS ALLOWED TO COME WITH YOU AND STAY IN THE WAITING ROOM ONLY DURING PRE OP AND PROCEDURE.   ?**NO VISITORS ARE ALLOWED IN THE SHORT STAY AREA OR RECOVERY ROOM!!** ? ?IF YOU WILL BE ADMITTED INTO THE HOSPITAL YOU ARE ALLOWED ONLY TWO SUPPORT PEOPLE DURING VISITATION HOURS ONLY (7 AM -8PM)   ?The support person(s) must pass our screening, gel in and out, and wear a mask at all times, including in the patient?s room. ?Patients must also wear a mask when staff or their support person are in the room. ?Visitors GUEST BADGE MUST BE WORN VISIBLY  ?One adult visitor may remain with you overnight and MUST be in the room by 8 P.M. ?  ? Your procedure is scheduled on: 11/13/21 ? ? Report to [facility] Main Entrance ? ?  Report to admitting at 7:45 AM ? ? Call this number if you have problems the morning of surgery [phone] ? ? Do not eat food :After Midnight. ? ? After Midnight you may have the following liquids until 7:30 AM DAY OF SURGERY ? ?Water ?Black Coffee (sugar ok, NO MILK/CREAM OR CREAMERS)  ?Tea (sugar ok, NO MILK/CREAM OR CREAMERS) regular and decaf                             ?Plain Jell-O (NO RED)                                           ?Fruit ices (not with fruit pulp, NO RED)                                     ?Popsicles (NO RED)                                                                  ?Juice: apple, WHITE grape, WHITE cranberry ?Sports drinks like Gatorade (NO RED) ?Clear broth(vegetable,chicken,beef) ?  ?The day of surgery:  ?Drink ONE (1) Pre-Surgery Clear Ensure at 7:30 AM the morning of surgery. Drink in one sitting. Do not sip.  ?This drink was given to you during your hospital  ?pre-op appointment visit. ?Nothing else to drink after completing the  ?Pre-Surgery Clear Ensure. ?  ?       If you have questions, please contact your surgeon?s office. ? ? ?FOLLOW BOWEL PREP AND ANY ADDITIONAL PRE OP INSTRUCTIONS YOU RECEIVED FROM  YOUR SURGEON'S OFFICE!!! ?  ?  ?Oral Hygiene is also important to reduce your risk of infection.                                    ?Remember - BRUSH YOUR TEETH THE MORNING OF SURGERY WITH YOUR REGULAR TOOTHPASTE ? ? Take these medicines the morning of surgery with A SIP OF WATER: Amlodipine, Atorvastatin, Norco, Flomax, Effexor.  ?                  ?  You may not have any metal on your body including jewelry, and body piercing ? ?           Do not wear lotions, powders, cologne, or deodorant ? ?            Men may shave face and neck. ? ? Do not bring valuables to the hospital. [facility] NOT ?            RESPONSIBLE   FOR VALUABLES. ? ? Bring small overnight bag day of surgery. ? ? Special Instructions: Bring a copy of your healthcare power of attorney and living will documents         the day of surgery if you haven't scanned them before. ? ?            Please read over the following fact sheets you were given: IF [place] [id number]- Apolonio Schneiders ? ?   [facility] - Preparing for Surgery ?Before surgery, you can play an important role.  Because skin is not sterile, your skin needs to be as free of germs as possible.  You can reduce the number of germs on your skin by washing with CHG (chlorahexidine gluconate) soap before surgery.  CHG is an antiseptic cleaner which kills germs and bonds with the skin to continue killing germs even after washing. ?Please DO NOT use if you have an allergy to CHG or antibacterial soaps.  If your skin becomes reddened/irritated stop using the CHG and inform your nurse when you arrive at Short Stay. ?Do not shave (including legs and underarms) for at least 48 hours prior to the first CHG shower.  You may shave your face/neck. ? ?Please follow these instructions carefully: ? 1.  Shower with CHG Soap the night before surgery and the  morning of surgery. ? 2.  If you choose to wash your hair, wash your hair first as usual  with your normal  shampoo. ? 3.  After you shampoo, rinse your hair and body thoroughly to remove the shampoo.                            ? 4.  Use CHG as you would any other liquid soap.  You can apply chg directly to the skin and wash.  Gently with a scrungie or clean washcloth. ? 5.  Apply the CHG Soap to your body ONLY FROM THE NECK DOWN.   Do   not use on face/ open      ?                     Wound or open sores. Avoid contact with eyes, ears mouth and   genitals (private parts).  ?                     Production manager,  Genitals (private parts) with your normal soap. ?            6.  Wash thoroughly, paying special attention to the area where your    surgery  will be performed. ? 7.  Thoroughly rinse your body with warm water from the neck down. ? 8.  DO NOT shower/wash with your normal soap after using and rinsing off the CHG Soap. ?               9.  Pat yourself dry with a  clean towel. ?           10.  Wear clean pajamas. ?           11.  Place clean sheets on your bed the night of your first shower and do not  sleep with pets. ?Day of Surgery : ?Do not apply any lotions/deodorants the morning of surgery.  Please wear clean clothes to the hospital/surgery center. ? ?FAILURE TO FOLLOW THESE INSTRUCTIONS MAY RESULT IN THE CANCELLATION OF YOUR SURGERY ? ?PATIENT SIGNATURE_________________________________ ? ?NURSE SIGNATURE__________________________________ ? ?________________________________________________________________________  ? ?Incentive Spirometer ? ?An incentive spirometer is a tool that can help keep your lungs clear and active. This tool measures how well you are filling your lungs with each breath. Taking long deep breaths may help reverse or decrease the chance of developing breathing (pulmonary) problems (especially infection) following: ?A long period of time when you are unable to move or be active. ?BEFORE THE PROCEDURE  ?If the spirometer includes an indicator to show your best effort, your nurse or  respiratory therapist will set it to a desired goal. ?If possible, sit up straight or lean slightly forward. Try not to slouch. ?Hold the incentive spirometer in an upright position. ?INSTRUCTIONS FOR USE  ?Sit on the edge of your bed if possible, or sit up as far as you can in bed or on a chair. ?Hold the incentive spirometer in an upright position. ?Breathe out normally. ?Place the mouthpiece in your mouth and seal your lips tightly around it. ?Breathe in slowly and as deeply as possible, raising the piston or the ball toward the top of the column. ?Hold your breath for 3-5 seconds or for as long as possible. Allow the piston or ball to fall to the bottom of the column. ?Remove the mouthpiece from your mouth and breathe out normally. ?Rest for a few seconds and repeat Steps 1 through 7 at least 10 times every 1-2 hours when you are awake. Take your time and take a few normal breaths between deep breaths. ?The spirometer may include an indicator to show your best effort. Use the indicator as a goal to work toward during each repetition. ?After each set of 10 deep breaths, practice coughing to be sure your lungs are clear. If you have an incision (the cut made at the time of surgery), support your incision when coughing by placing a pillow or rolled up towels firmly against it. ?Once you are able to get out of bed, walk around indoors and cough well. You may stop using the incentive spirometer when instructed by your caregiver.  ?RISKS AND COMPLICATIONS ?Take your time so you do not get dizzy or light-headed. ?If you are in pain, you may need to take or ask for pain medication before doing incentive spirometry. It is harder to take a deep breath if you are having pain. ?AFTER USE ?Rest and breathe slowly and easily. ?It can be helpful to keep track of a log of your progress. Your caregiver can provide you with a simple table to help with this. ?If you are using the spirometer at home, follow these  instructions: ?SEEK MEDICAL CARE IF:  ?You are having difficultly using the spirometer. ?You have trouble using the spirometer as often as instructed. ?Your pain medication is not giving enough relief while using the spi

## 2021-11-02 ENCOUNTER — Encounter (HOSPITAL_COMMUNITY): Payer: Self-pay

## 2021-11-02 ENCOUNTER — Encounter (HOSPITAL_COMMUNITY)
Admission: RE | Admit: 2021-11-02 | Discharge: 2021-11-02 | Disposition: A | Discharge status: Home / Self Care | Payer: Medicare Other | Source: Ambulatory Visit | Attending: Orthopedic Surgery | Admitting: Orthopedic Surgery

## 2021-11-02 VITALS — BP 122/90 | HR 95 | Temp 98.4°F | Resp 16 | Ht 68.0 in | Wt 190.6 lb

## 2021-11-02 DIAGNOSIS — M1711 Unilateral primary osteoarthritis, right knee: Secondary | ICD-10-CM | POA: Diagnosis not present

## 2021-11-02 DIAGNOSIS — Z01818 Encounter for other preprocedural examination: Secondary | ICD-10-CM | POA: Insufficient documentation

## 2021-11-02 HISTORY — DX: Personal history of urinary calculi: Z87.442

## 2021-11-02 LAB — COMPREHENSIVE METABOLIC PANEL
ALT: 16 U/L (ref 0–44)
AST: 19 U/L (ref 15–41)
Albumin: 4.3 g/dL (ref 3.5–5.0)
Alkaline Phosphatase: 55 U/L (ref 38–126)
Anion gap: 6 (ref 5–15)
BUN: 20 mg/dL (ref 8–23)
CO2: 30 mmol/L (ref 22–32)
Calcium: 9.7 mg/dL (ref 8.9–10.3)
Chloride: 104 mmol/L (ref 98–111)
Creatinine, Ser: 0.88 mg/dL (ref 0.61–1.24)
GFR, Estimated: >60 mL/min (ref >60)
Glucose, Bld: 141 mg/dL — ABNORMAL HIGH (ref 70–99)
Potassium: 3.4 mmol/L — ABNORMAL LOW (ref 3.5–5.1)
Sodium: 140 mmol/L (ref 135–145)
Total Bilirubin: 0.4 mg/dL (ref 0.3–1.2)
Total Protein: 7 g/dL (ref 6.5–8.1)

## 2021-11-02 LAB — CBC
HCT: 41.8 % (ref 39.0–52.0)
Hemoglobin: 14.4 g/dL (ref 13.0–17.0)
MCH: 28.9 pg (ref 26.0–34.0)
MCHC: 34.4 g/dL (ref 30.0–36.0)
MCV: 83.8 fL (ref 80.0–100.0)
Platelets: 277 10*3/uL (ref 150–400)
RBC: 4.99 MIL/uL (ref 4.22–5.81)
RDW: 13.2 % (ref 11.5–15.5)
WBC: 6.9 10*3/uL (ref 4.0–10.5)
nRBC: 0 % (ref 0.0–0.2)

## 2021-11-02 LAB — SURGICAL PCR SCREEN
MRSA, PCR: NEGATIVE
Staphylococcus aureus: NEGATIVE

## 2021-11-09 ENCOUNTER — Encounter (HOSPITAL_COMMUNITY)
Admission: RE | Admit: 2021-11-09 | Discharge: 2021-11-09 | Disposition: A | Discharge status: Home / Self Care | Payer: Medicare Other | Source: Ambulatory Visit | Attending: Orthopedic Surgery | Admitting: Orthopedic Surgery

## 2021-11-13 ENCOUNTER — Ambulatory Visit (HOSPITAL_COMMUNITY): Payer: Medicare Other | Admitting: Emergency Medicine

## 2021-11-13 ENCOUNTER — Encounter (HOSPITAL_COMMUNITY): Payer: Self-pay | Admitting: Orthopedic Surgery

## 2021-11-13 ENCOUNTER — Other Ambulatory Visit: Payer: Self-pay

## 2021-11-13 ENCOUNTER — Ambulatory Visit (HOSPITAL_BASED_OUTPATIENT_CLINIC_OR_DEPARTMENT_OTHER): Payer: Medicare Other | Admitting: Anesthesiology

## 2021-11-13 ENCOUNTER — Encounter (HOSPITAL_COMMUNITY)
Admission: RE | Disposition: A | Discharge status: Home / Self Care | Payer: Self-pay | Source: Home / Self Care | Attending: Orthopedic Surgery

## 2021-11-13 ENCOUNTER — Observation Stay (HOSPITAL_COMMUNITY)
Admission: RE | Admit: 2021-11-13 | Discharge: 2021-11-14 | Disposition: A | Discharge status: Home / Self Care | Payer: Medicare Other | Attending: Orthopedic Surgery | Admitting: Orthopedic Surgery

## 2021-11-13 DIAGNOSIS — I10 Essential (primary) hypertension: Secondary | ICD-10-CM | POA: Insufficient documentation

## 2021-11-13 DIAGNOSIS — Z85038 Personal history of other malignant neoplasm of large intestine: Secondary | ICD-10-CM | POA: Diagnosis not present

## 2021-11-13 DIAGNOSIS — Z79899 Other long term (current) drug therapy: Secondary | ICD-10-CM | POA: Diagnosis not present

## 2021-11-13 DIAGNOSIS — Z8546 Personal history of malignant neoplasm of prostate: Secondary | ICD-10-CM | POA: Insufficient documentation

## 2021-11-13 DIAGNOSIS — Z87891 Personal history of nicotine dependence: Secondary | ICD-10-CM | POA: Diagnosis not present

## 2021-11-13 DIAGNOSIS — M1711 Unilateral primary osteoarthritis, right knee: Secondary | ICD-10-CM

## 2021-11-13 DIAGNOSIS — Z96652 Presence of left artificial knee joint: Secondary | ICD-10-CM | POA: Diagnosis not present

## 2021-11-13 DIAGNOSIS — M179 Osteoarthritis of knee, unspecified: Secondary | ICD-10-CM | POA: Diagnosis present

## 2021-11-13 HISTORY — PX: TOTAL KNEE ARTHROPLASTY: SHX125

## 2021-11-13 SURGERY — ARTHROPLASTY, KNEE, TOTAL
Anesthesia: Monitor Anesthesia Care | Site: Knee | Laterality: Right

## 2021-11-13 MED ORDER — EPHEDRINE 5 MG/ML INJ
INTRAVENOUS | Status: AC
  Filled 2021-11-13: qty 5

## 2021-11-13 MED ORDER — BISACODYL 10 MG RE SUPP
10.0000 mg | Freq: Every day | RECTAL | Status: DC | PRN
Start: 2021-11-13 — End: 2021-11-14

## 2021-11-13 MED ORDER — OXYCODONE HCL 5 MG PO TABS: 5.0000 mg | ORAL_TABLET | ORAL | Status: DC | PRN

## 2021-11-13 MED ORDER — DIPHENHYDRAMINE HCL 12.5 MG/5ML PO ELIX: 12.5000 mg | ORAL_SOLUTION | ORAL | Status: DC | PRN

## 2021-11-13 MED ORDER — EPHEDRINE SULFATE-NACL 50-0.9 MG/10ML-% IV SOSY
PREFILLED_SYRINGE | INTRAVENOUS | Status: DC | PRN
Start: 2021-11-13 — End: 2021-11-13
  Administered 2021-11-13: 5 mg via INTRAVENOUS
  Administered 2021-11-13: 10 mg via INTRAVENOUS
  Administered 2021-11-13: 7.5 mg via INTRAVENOUS
  Administered 2021-11-13: 10 mg via INTRAVENOUS

## 2021-11-13 MED ORDER — OXYCODONE HCL 5 MG PO TABS: 5.0000 mg | ORAL_TABLET | Freq: Once | ORAL | Status: DC | PRN

## 2021-11-13 MED ORDER — FENTANYL CITRATE PF 50 MCG/ML IJ SOSY
50.0000 ug | PREFILLED_SYRINGE | INTRAMUSCULAR | Status: DC
  Administered 2021-11-13: 100 ug via INTRAVENOUS
  Filled 2021-11-13: qty 2

## 2021-11-13 MED ORDER — PHENYLEPHRINE HCL-NACL 20-0.9 MG/250ML-% IV SOLN
INTRAVENOUS | Status: DC | PRN
  Administered 2021-11-13: 50 ug/min via INTRAVENOUS

## 2021-11-13 MED ORDER — METHOCARBAMOL 500 MG PO TABS
500.0000 mg | ORAL_TABLET | Freq: Four times a day (QID) | ORAL | Status: DC | PRN
  Administered 2021-11-13 – 2021-11-14 (×3): 500 mg via ORAL
  Filled 2021-11-13 (×3): qty 1

## 2021-11-13 MED ORDER — ACETAMINOPHEN 10 MG/ML IV SOLN
1000.0000 mg | Freq: Four times a day (QID) | INTRAVENOUS | Status: DC
  Administered 2021-11-13: 1000 mg via INTRAVENOUS
  Filled 2021-11-13: qty 100

## 2021-11-13 MED ORDER — LACTATED RINGERS IV SOLN: INTRAVENOUS | Status: DC

## 2021-11-13 MED ORDER — GABAPENTIN 300 MG PO CAPS
300.0000 mg | ORAL_CAPSULE | Freq: Three times a day (TID) | ORAL | Status: DC
  Administered 2021-11-13 – 2021-11-14 (×3): 300 mg via ORAL
  Filled 2021-11-13 (×3): qty 1

## 2021-11-13 MED ORDER — PHENYLEPHRINE HCL (PRESSORS) 10 MG/ML IV SOLN
INTRAVENOUS | Status: AC
  Filled 2021-11-13: qty 1

## 2021-11-13 MED ORDER — POLYETHYLENE GLYCOL 3350 17 G PO PACK: 17.0000 g | PACK | Freq: Every day | ORAL | Status: DC | PRN

## 2021-11-13 MED ORDER — PROPOFOL 10 MG/ML IV BOLUS
INTRAVENOUS | Status: DC | PRN
  Administered 2021-11-13 (×2): 20 mg via INTRAVENOUS
  Administered 2021-11-13 (×2): 30 mg via INTRAVENOUS

## 2021-11-13 MED ORDER — ORAL CARE MOUTH RINSE: 15.0000 mL | Freq: Once | OROMUCOSAL | Status: DC

## 2021-11-13 MED ORDER — OXYCODONE HCL 5 MG/5ML PO SOLN: 5.0000 mg | Freq: Once | ORAL | Status: DC | PRN

## 2021-11-13 MED ORDER — AMLODIPINE BESYLATE 10 MG PO TABS
10.0000 mg | ORAL_TABLET | Freq: Every day | ORAL | Status: DC
  Administered 2021-11-14: 10 mg via ORAL
  Filled 2021-11-13: qty 1

## 2021-11-13 MED ORDER — CLONIDINE HCL (ANALGESIA) 100 MCG/ML EP SOLN
EPIDURAL | Status: DC | PRN
  Administered 2021-11-13: 100 ug

## 2021-11-13 MED ORDER — MIDAZOLAM HCL 2 MG/2ML IJ SOLN
1.0000 mg | INTRAMUSCULAR | Status: DC
  Administered 2021-11-13: 2 mg via INTRAVENOUS
  Filled 2021-11-13: qty 2

## 2021-11-13 MED ORDER — VENLAFAXINE HCL ER 150 MG PO CP24
150.0000 mg | ORAL_CAPSULE | Freq: Every day | ORAL | Status: DC
  Administered 2021-11-14: 150 mg via ORAL
  Filled 2021-11-13: qty 1

## 2021-11-13 MED ORDER — DEXAMETHASONE SODIUM PHOSPHATE 10 MG/ML IJ SOLN
INTRAMUSCULAR | Status: AC
  Filled 2021-11-13: qty 1

## 2021-11-13 MED ORDER — METOCLOPRAMIDE HCL 5 MG PO TABS: 5.0000 mg | ORAL_TABLET | Freq: Three times a day (TID) | ORAL | Status: DC | PRN

## 2021-11-13 MED ORDER — BUPIVACAINE LIPOSOME 1.3 % IJ SUSP
INTRAMUSCULAR | Status: AC
  Filled 2021-11-13: qty 20

## 2021-11-13 MED ORDER — METHOCARBAMOL 500 MG IVPB - SIMPLE MED
INTRAVENOUS | Status: AC
  Administered 2021-11-13: 500 mg via INTRAVENOUS
  Filled 2021-11-13: qty 50

## 2021-11-13 MED ORDER — FLEET ENEMA 7-19 GM/118ML RE ENEM: 1.0000 | ENEMA | Freq: Once | RECTAL | Status: DC | PRN

## 2021-11-13 MED ORDER — MORPHINE SULFATE (PF) 2 MG/ML IV SOLN
1.0000 mg | INTRAVENOUS | Status: DC | PRN
  Administered 2021-11-13: 2 mg via INTRAVENOUS
  Administered 2021-11-13: 1 mg via INTRAVENOUS
  Filled 2021-11-13 (×2): qty 1

## 2021-11-13 MED ORDER — ONDANSETRON HCL 4 MG/2ML IJ SOLN
INTRAMUSCULAR | Status: DC | PRN
Start: 2021-11-13 — End: 2021-11-13
  Administered 2021-11-13: 4 mg via INTRAVENOUS

## 2021-11-13 MED ORDER — SODIUM CHLORIDE (PF) 0.9 % IJ SOLN
INTRAMUSCULAR | Status: AC
  Filled 2021-11-13: qty 10

## 2021-11-13 MED ORDER — ONDANSETRON HCL 4 MG/2ML IJ SOLN: 4.0000 mg | Freq: Once | INTRAMUSCULAR | Status: DC | PRN

## 2021-11-13 MED ORDER — ONDANSETRON HCL 4 MG/2ML IJ SOLN
INTRAMUSCULAR | Status: AC
  Filled 2021-11-13: qty 2

## 2021-11-13 MED ORDER — SODIUM CHLORIDE 0.9 % IR SOLN
Status: DC | PRN
Start: 2021-11-13 — End: 2021-11-13
  Administered 2021-11-13: 1000 mL

## 2021-11-13 MED ORDER — ACETAMINOPHEN 500 MG PO TABS
1000.0000 mg | ORAL_TABLET | Freq: Four times a day (QID) | ORAL | Status: DC
  Administered 2021-11-13 – 2021-11-14 (×3): 1000 mg via ORAL
  Filled 2021-11-13 (×3): qty 2

## 2021-11-13 MED ORDER — METOCLOPRAMIDE HCL 5 MG/ML IJ SOLN: 5.0000 mg | Freq: Three times a day (TID) | INTRAMUSCULAR | Status: DC | PRN

## 2021-11-13 MED ORDER — PROPOFOL 500 MG/50ML IV EMUL
INTRAVENOUS | Status: DC | PRN
  Administered 2021-11-13: 75 ug/kg/min via INTRAVENOUS

## 2021-11-13 MED ORDER — PHENYLEPHRINE 80 MCG/ML (10ML) SYRINGE FOR IV PUSH (FOR BLOOD PRESSURE SUPPORT)
PREFILLED_SYRINGE | INTRAVENOUS | Status: AC
  Filled 2021-11-13: qty 10

## 2021-11-13 MED ORDER — ACETAMINOPHEN 160 MG/5ML PO SOLN: 325.0000 mg | ORAL | Status: DC | PRN

## 2021-11-13 MED ORDER — BUPIVACAINE LIPOSOME 1.3 % IJ SUSP
INTRAMUSCULAR | Status: DC | PRN
  Administered 2021-11-13: 20 mL

## 2021-11-13 MED ORDER — PROPOFOL 500 MG/50ML IV EMUL
INTRAVENOUS | Status: AC
  Filled 2021-11-13: qty 50

## 2021-11-13 MED ORDER — ROPIVACAINE HCL 7.5 MG/ML IJ SOLN
INTRAMUSCULAR | Status: DC | PRN
  Administered 2021-11-13: 25 mL via PERINEURAL

## 2021-11-13 MED ORDER — PHENYLEPHRINE 80 MCG/ML (10ML) SYRINGE FOR IV PUSH (FOR BLOOD PRESSURE SUPPORT)
PREFILLED_SYRINGE | INTRAVENOUS | Status: DC | PRN
  Administered 2021-11-13: 80 ug via INTRAVENOUS
  Administered 2021-11-13: 120 ug via INTRAVENOUS
  Administered 2021-11-13: 80 ug via INTRAVENOUS
  Administered 2021-11-13: 160 ug via INTRAVENOUS
  Administered 2021-11-13: 80 ug via INTRAVENOUS
  Administered 2021-11-13: 160 ug via INTRAVENOUS
  Administered 2021-11-13: 40 ug via INTRAVENOUS
  Administered 2021-11-13: 80 ug via INTRAVENOUS

## 2021-11-13 MED ORDER — CEFAZOLIN SODIUM-DEXTROSE 2-4 GM/100ML-% IV SOLN
2.0000 g | Freq: Four times a day (QID) | INTRAVENOUS | Status: AC
  Administered 2021-11-13 (×2): 2 g via INTRAVENOUS
  Filled 2021-11-13 (×2): qty 100

## 2021-11-13 MED ORDER — MEPERIDINE HCL 50 MG/ML IJ SOLN: 6.2500 mg | INTRAMUSCULAR | Status: DC | PRN

## 2021-11-13 MED ORDER — CEFAZOLIN SODIUM-DEXTROSE 2-4 GM/100ML-% IV SOLN
2.0000 g | INTRAVENOUS | Status: AC
  Administered 2021-11-13: 2 g via INTRAVENOUS
  Filled 2021-11-13: qty 100

## 2021-11-13 MED ORDER — MENTHOL 3 MG MT LOZG: 1.0000 | LOZENGE | OROMUCOSAL | Status: DC | PRN

## 2021-11-13 MED ORDER — ACETAMINOPHEN 325 MG PO TABS: 325.0000 mg | ORAL_TABLET | ORAL | Status: DC | PRN

## 2021-11-13 MED ORDER — SODIUM CHLORIDE (PF) 0.9 % IJ SOLN
INTRAMUSCULAR | Status: AC
  Filled 2021-11-13: qty 50

## 2021-11-13 MED ORDER — SODIUM CHLORIDE 0.9 % IV SOLN: INTRAVENOUS | Status: DC

## 2021-11-13 MED ORDER — ATORVASTATIN CALCIUM 10 MG PO TABS
10.0000 mg | ORAL_TABLET | Freq: Every day | ORAL | Status: DC
  Administered 2021-11-14: 10 mg via ORAL
  Filled 2021-11-13: qty 1

## 2021-11-13 MED ORDER — RIVAROXABAN 10 MG PO TABS
10.0000 mg | ORAL_TABLET | Freq: Every day | ORAL | Status: DC
  Administered 2021-11-14: 10 mg via ORAL
  Filled 2021-11-13: qty 1

## 2021-11-13 MED ORDER — ONDANSETRON HCL 4 MG/2ML IJ SOLN: 4.0000 mg | Freq: Four times a day (QID) | INTRAMUSCULAR | Status: DC | PRN

## 2021-11-13 MED ORDER — PHENOL 1.4 % MT LIQD: 1.0000 | OROMUCOSAL | Status: DC | PRN

## 2021-11-13 MED ORDER — CHLORHEXIDINE GLUCONATE 0.12 % MT SOLN: 15.0000 mL | Freq: Once | OROMUCOSAL | Status: DC

## 2021-11-13 MED ORDER — METHOCARBAMOL 500 MG IVPB - SIMPLE MED
500.0000 mg | Freq: Four times a day (QID) | INTRAVENOUS | Status: DC | PRN
  Filled 2021-11-13: qty 50

## 2021-11-13 MED ORDER — OXYCODONE HCL 5 MG PO TABS
10.0000 mg | ORAL_TABLET | ORAL | Status: DC | PRN
  Administered 2021-11-13: 15 mg via ORAL
  Administered 2021-11-13: 10 mg via ORAL
  Administered 2021-11-13 – 2021-11-14 (×3): 15 mg via ORAL
  Filled 2021-11-13: qty 2
  Filled 2021-11-13 (×4): qty 3

## 2021-11-13 MED ORDER — TRANEXAMIC ACID-NACL 1000-0.7 MG/100ML-% IV SOLN
1000.0000 mg | INTRAVENOUS | Status: AC
  Administered 2021-11-13: 1000 mg via INTRAVENOUS
  Filled 2021-11-13: qty 100

## 2021-11-13 MED ORDER — STERILE WATER FOR IRRIGATION IR SOLN
Status: DC | PRN
  Administered 2021-11-13: 2000 mL

## 2021-11-13 MED ORDER — 0.9 % SODIUM CHLORIDE (POUR BTL) OPTIME
TOPICAL | Status: DC | PRN
  Administered 2021-11-13: 1000 mL

## 2021-11-13 MED ORDER — DOCUSATE SODIUM 100 MG PO CAPS
100.0000 mg | ORAL_CAPSULE | Freq: Two times a day (BID) | ORAL | Status: DC
  Administered 2021-11-13 – 2021-11-14 (×3): 100 mg via ORAL
  Filled 2021-11-13 (×3): qty 1

## 2021-11-13 MED ORDER — FENTANYL CITRATE PF 50 MCG/ML IJ SOSY
PREFILLED_SYRINGE | INTRAMUSCULAR | Status: AC
  Administered 2021-11-13: 25 ug via INTRAVENOUS
  Filled 2021-11-13: qty 2

## 2021-11-13 MED ORDER — BUPIVACAINE LIPOSOME 1.3 % IJ SUSP: 20.0000 mL | Freq: Once | INTRAMUSCULAR | Status: DC

## 2021-11-13 MED ORDER — TRIAMTERENE-HCTZ 37.5-25 MG PO TABS
1.0000 | ORAL_TABLET | Freq: Every day | ORAL | Status: DC
  Filled 2021-11-13: qty 1

## 2021-11-13 MED ORDER — ONDANSETRON HCL 4 MG PO TABS: 4.0000 mg | ORAL_TABLET | Freq: Four times a day (QID) | ORAL | Status: DC | PRN

## 2021-11-13 MED ORDER — BUPIVACAINE IN DEXTROSE 0.75-8.25 % IT SOLN
INTRATHECAL | Status: DC | PRN
  Administered 2021-11-13: 1.6 mL via INTRATHECAL

## 2021-11-13 MED ORDER — FENTANYL CITRATE PF 50 MCG/ML IJ SOSY
25.0000 ug | PREFILLED_SYRINGE | INTRAMUSCULAR | Status: DC | PRN
  Administered 2021-11-13 (×3): 25 ug via INTRAVENOUS

## 2021-11-13 MED ORDER — DEXAMETHASONE SODIUM PHOSPHATE 10 MG/ML IJ SOLN
8.0000 mg | Freq: Once | INTRAMUSCULAR | Status: AC
  Administered 2021-11-13: 8 mg via INTRAVENOUS

## 2021-11-13 MED ORDER — DEXAMETHASONE SODIUM PHOSPHATE 10 MG/ML IJ SOLN
10.0000 mg | Freq: Once | INTRAMUSCULAR | Status: AC
  Administered 2021-11-14: 10 mg via INTRAVENOUS
  Filled 2021-11-13: qty 1

## 2021-11-13 MED ORDER — SODIUM CHLORIDE (PF) 0.9 % IJ SOLN
INTRAMUSCULAR | Status: DC | PRN
  Administered 2021-11-13: 60 mL

## 2021-11-13 MED ORDER — POVIDONE-IODINE 10 % EX SWAB: 2.0000 "application " | Freq: Once | CUTANEOUS | Status: DC

## 2021-11-13 MED ORDER — TAMSULOSIN HCL 0.4 MG PO CAPS
0.4000 mg | ORAL_CAPSULE | Freq: Two times a day (BID) | ORAL | Status: DC
  Administered 2021-11-13 – 2021-11-14 (×2): 0.4 mg via ORAL
  Filled 2021-11-13 (×2): qty 1

## 2021-11-13 SURGICAL SUPPLY — 53 items
ATTUNE MED DOME PAT 38 KNEE (Knees) ×1 IMPLANT
ATTUNE PS FEM RT SZ 6 CEM KNEE (Femur) ×1 IMPLANT
ATTUNE PSRP INSR SZ6 10 KNEE (Insert) ×1 IMPLANT
BAG COUNTER SPONGE SURGICOUNT (BAG) ×1 IMPLANT
BAG ZIPLOCK 12X15 (MISCELLANEOUS) ×2 IMPLANT
BASE TIBIAL ROT PLAT SZ 7 KNEE (Knees) IMPLANT
BLADE SAG 18X100X1.27 (BLADE) ×2 IMPLANT
BLADE SAW SGTL 11.0X1.19X90.0M (BLADE) ×2 IMPLANT
BNDG ELASTIC 6X5.8 VLCR STR LF (GAUZE/BANDAGES/DRESSINGS) ×2 IMPLANT
BOWL SMART MIX CTS (DISPOSABLE) ×2 IMPLANT
CEMENT HV SMART SET (Cement) ×4 IMPLANT
COVER SURGICAL LIGHT HANDLE (MISCELLANEOUS) ×2 IMPLANT
CUFF TOURN SGL QUICK 34 (TOURNIQUET CUFF) ×2
CUFF TRNQT CYL 34X4.125X (TOURNIQUET CUFF) ×1 IMPLANT
DRAPE INCISE IOBAN 66X45 STRL (DRAPES) ×2 IMPLANT
DRAPE U-SHAPE 47X51 STRL (DRAPES) ×2 IMPLANT
DRSG AQUACEL AG ADV 3.5X10 (GAUZE/BANDAGES/DRESSINGS) ×2 IMPLANT
DURAPREP 26ML APPLICATOR (WOUND CARE) ×2 IMPLANT
ELECT REM PT RETURN 15FT ADLT (MISCELLANEOUS) ×2 IMPLANT
GLOVE BIO SURGEON STRL SZ 6.5 (GLOVE) ×1 IMPLANT
GLOVE BIO SURGEON STRL SZ7.5 (GLOVE) IMPLANT
GLOVE BIO SURGEON STRL SZ8 (GLOVE) ×2 IMPLANT
GLOVE BIOGEL PI IND STRL 7.0 (GLOVE) IMPLANT
GLOVE BIOGEL PI IND STRL 8 (GLOVE) ×1 IMPLANT
GLOVE BIOGEL PI INDICATOR 7.0 (GLOVE) ×1
GLOVE BIOGEL PI INDICATOR 8 (GLOVE) ×1
GOWN STRL REUS W/ TWL LRG LVL3 (GOWN DISPOSABLE) ×1 IMPLANT
GOWN STRL REUS W/ TWL XL LVL3 (GOWN DISPOSABLE) IMPLANT
GOWN STRL REUS W/TWL LRG LVL3 (GOWN DISPOSABLE) ×2
GOWN STRL REUS W/TWL XL LVL3 (GOWN DISPOSABLE)
HANDPIECE INTERPULSE COAX TIP (DISPOSABLE) ×2
HOLDER FOLEY CATH W/STRAP (MISCELLANEOUS) ×1 IMPLANT
IMMOBILIZER KNEE 20 (SOFTGOODS) ×2
IMMOBILIZER KNEE 20 THIGH 36 (SOFTGOODS) ×1 IMPLANT
KIT TURNOVER KIT A (KITS) ×1 IMPLANT
MANIFOLD NEPTUNE II (INSTRUMENTS) ×2 IMPLANT
NS IRRIG 1000ML POUR BTL (IV SOLUTION) ×2 IMPLANT
PACK TOTAL KNEE CUSTOM (KITS) ×2 IMPLANT
PADDING CAST COTTON 6X4 STRL (CAST SUPPLIES) ×4 IMPLANT
PROTECTOR NERVE ULNAR (MISCELLANEOUS) ×2 IMPLANT
SET HNDPC FAN SPRY TIP SCT (DISPOSABLE) ×1 IMPLANT
SPIKE FLUID TRANSFER (MISCELLANEOUS) ×2 IMPLANT
STRIP CLOSURE SKIN 1/2X4 (GAUZE/BANDAGES/DRESSINGS) ×4 IMPLANT
SUT MNCRL AB 4-0 PS2 18 (SUTURE) ×2 IMPLANT
SUT STRATAFIX 0 PDS 27 VIOLET (SUTURE) ×2
SUT VIC AB 2-0 CT1 27 (SUTURE) ×6
SUT VIC AB 2-0 CT1 TAPERPNT 27 (SUTURE) ×3 IMPLANT
SUTURE STRATFX 0 PDS 27 VIOLET (SUTURE) ×1 IMPLANT
TIBIAL BASE ROT PLAT SZ 7 KNEE (Knees) ×2 IMPLANT
TRAY FOLEY MTR SLVR 16FR STAT (SET/KITS/TRAYS/PACK) ×2 IMPLANT
TUBE SUCTION HIGH CAP CLEAR NV (SUCTIONS) ×2 IMPLANT
WATER STERILE IRR 1000ML POUR (IV SOLUTION) ×4 IMPLANT
WRAP KNEE MAXI GEL POST OP (GAUZE/BANDAGES/DRESSINGS) ×2 IMPLANT

## 2021-11-13 NOTE — Anesthesia Procedure Notes (Signed)
Anesthesia Regional Block: Adductor canal block  ? ?Pre-Anesthetic Checklist: , timeout performed,  Correct Patient, Correct Site, Correct Laterality,  Correct Procedure, Correct Position, site marked,  Risks and benefits discussed,  Surgical consent,  Pre-op evaluation,  At surgeon's request and post-op pain management ? ?Laterality: Right ? ?Prep: chloraprep     ?  ?Needles:  ?Injection technique: Single-shot ? ?Needle Type: Echogenic Stimulator Needle   ? ? ?Needle Length: 5cm  ?Needle Gauge: 22  ? ? ? ?Additional Needles: ? ? ?Procedures:, nerve stimulator,,, ultrasound used (permanent image in chart),,    ?Narrative:  ?Start time: 11/13/2021 9:50 AM ?End time: 11/13/2021 10:01 AM ?Injection made incrementally with aspirations every 5 mL. ? ?Performed by: Personally  ?Anesthesiologist: Janeece Riggers, MD ? ?Additional Notes: ?Functioning IV was confirmed and monitors were applied.  A 53m 22ga Arrow echogenic stimulator needle was used. Sterile prep and drape,hand hygiene and sterile gloves were used. Ultrasound guidance: relevant anatomy identified, needle position confirmed, local anesthetic spread visualized around nerve(s)., vascular puncture avoided.  Image printed for medical record. Negative aspiration and negative test dose prior to incremental administration of local anesthetic. The patient tolerated the procedure well. ? ? ? ? ? ?

## 2021-11-13 NOTE — Interval H&P Note (Signed)
History and Physical Interval Note: ? ?11/13/2021 ?8:07 AM ? ?Donald [name]  has presented today for surgery, with the diagnosis of Right knee osteoarthritis.  The various methods of treatment have been discussed with the patient and family. After consideration of risks, benefits and other options for treatment, the patient has consented to  Procedure(s): ?TOTAL KNEE ARTHROPLASTY (Right) as a surgical intervention.  The patient's history has been reviewed, patient examined, no change in status, stable for surgery.  I have reviewed the patient's chart and labs.  Questions were answered to the patient's satisfaction.   ? ? ?Donald [name] ? ? ?

## 2021-11-13 NOTE — Op Note (Signed)
OPERATIVE REPORT-TOTAL KNEE ARTHROPLASTY ? ? ?Pre-operative diagnosis- Osteoarthritis  Right knee(s) ? ?Post-operative diagnosis- Osteoarthritis Right knee(s) ? ?Procedure-  Right  Total Knee Arthroplasty ? ?Surgeon- Donald Plover. Jamori Biggar, MD ? ?Assistant- Jaynie Bream, PA-C  ? ?Anesthesia-   Adductor canal block and spinal ? ?EBL-25 mL ?  ?Drains None ? ?Tourniquet time-  ?Total Tourniquet Time Documented: ?Thigh (Right) - 36 minutes ?Total: Thigh (Right) - 36 minutes ?   ? ?Complications- None ? ?Condition-PACU - hemodynamically stable.  ? ?Brief Clinical Note  Donald [name] is a 76 y.o. year old male with end stage OA of his right knee with progressively worsening pain and dysfunction. He has constant pain, with activity and at rest and significant functional deficits with difficulties even with ADLs. He has had extensive non-op management including analgesics, injections of cortisone and viscosupplements, and home exercise program, but remains in significant pain with significant dysfunction. Radiographs show bone on bone arthritis lateral and patellofemoral. He presents now for right Total Knee Arthroplasty.    ? ?Procedure in detail--- ? ? The patient is brought into the operating room and positioned supine on the operating table. After successful administration of  Adductor canal block and spinal,   a tourniquet is placed high on the  Right thigh(s) and the lower extremity is prepped and draped in the usual sterile fashion. Time out is performed by the operating team and then the  Right lower extremity is wrapped in Esmarch, knee flexed and the tourniquet inflated to 300 mmHg.  ?     A midline incision is made with a ten blade through the subcutaneous tissue to the level of the extensor mechanism. A fresh blade is used to make a medial parapatellar arthrotomy. Soft tissue over the proximal medial tibia is subperiosteally elevated to the joint line with a knife and into the semimembranosus bursa with a Cobb  elevator. Soft tissue over the proximal lateral tibia is elevated with attention being paid to avoiding the patellar tendon on the tibial tubercle. The patella is everted, knee flexed 90 degrees and the ACL and PCL are removed. Findings are bone on bone lateral and patellofemoral with large global osteophytes   ?     The drill is used to create a starting hole in the distal femur and the canal is thoroughly irrigated with sterile saline to remove the fatty contents. The 5 degree Right  valgus alignment guide is placed into the femoral canal and the distal femoral cutting block is pinned to remove 9 mm off the distal femur. Resection is made with an oscillating saw. ?     The tibia is subluxed forward and the menisci are removed. The extramedullary alignment guide is placed referencing proximally at the medial aspect of the tibial tubercle and distally along the second metatarsal axis and tibial crest. The block is pinned to remove 40m off the more deficient lateral  side. Resection is made with an oscillating saw. Size 7is the most appropriate size for the tibia and the proximal tibia is prepared with the modular drill and keel punch for that size. ?     The femoral sizing guide is placed and size 6 is most appropriate. Rotation is marked off the epicondylar axis and confirmed by creating a rectangular flexion gap at 90 degrees. The size 6 cutting block is pinned in this rotation and the anterior, posterior and chamfer cuts are made with the oscillating saw. The intercondylar block is then placed and that cut is  made. ?     Trial size 7 tibial component, trial size 6 posterior stabilized femur and a 10  mm posterior stabilized rotating platform insert trial is placed. Full extension is achieved with excellent varus/valgus and anterior/posterior balance throughout full range of motion. The patella is everted and thickness measured to be 24  mm. Free hand resection is taken to 14 mm, a 38 template is placed, lug holes  are drilled, trial patella is placed, and it tracks normally. Osteophytes are removed off the posterior femur with the trial in place. All trials are removed and the cut bone surfaces prepared with pulsatile lavage. Cement is mixed and once ready for implantation, the size 7 tibial implant, size  6 posterior stabilized femoral component, and the size 38 patella are cemented in place and the patella is held with the clamp. The trial insert is placed and the knee held in full extension. The Exparel (20 ml mixed with 60 ml saline) is injected into the extensor mechanism, posterior capsule, medial and lateral gutters and subcutaneous tissues.  All extruded cement is removed and once the cement is hard the permanent 10 mm posterior stabilized rotating platform insert is placed into the tibial tray. ?     The wound is copiously irrigated with saline solution and the extensor mechanism closed with # 0 Stratofix suture. The tourniquet is released for a total tourniquet time of 36  minutes. Flexion against gravity is 140 degrees and the patella tracks normally. Subcutaneous tissue is closed with 2.0 vicryl and subcuticular with running 4.0 Monocryl. The incision is cleaned and dried and steri-strips and a bulky sterile dressing are applied. The limb is placed into a knee immobilizer and the patient is awakened and transported to recovery in stable condition. ?     Please note that a surgical assistant was a medical necessity for this procedure in order to perform it in a safe and expeditious manner. Surgical assistant was necessary to retract the ligaments and vital neurovascular structures to prevent injury to them and also necessary for proper positioning of the limb to allow for anatomic placement of the prosthesis. ? ? ?Donald Plover [name], MD ? ? ? ?11/13/2021, 11:20 AM ? ? ? ?

## 2021-11-13 NOTE — Anesthesia Procedure Notes (Signed)
Spinal ? ?Patient location during procedure: OR ?Start time: 11/13/2021 10:15 AM ?End time: 11/13/2021 10:21 AM ?Reason for block: surgical anesthesia ?Staffing ?Anesthesiologist: Janeece Riggers, MD ?Preanesthetic Checklist ?Completed: patient identified, IV checked, site marked, risks and benefits discussed, surgical consent, monitors and equipment checked, pre-op evaluation and timeout performed ?Spinal Block ?Patient position: sitting ?Prep: DuraPrep ?Patient monitoring: heart rate, cardiac monitor, continuous pulse ox and blood pressure ?Approach: midline ?Location: L3-4 ?Injection technique: single-shot ?Needle ?Needle type: Sprotte  ?Needle gauge: 24 G ?Needle length: 9 cm ?Assessment ?Sensory level: T4 ?Events: CSF return ?Additional Notes ?Multiple attempts osso ?Change to one level up +csf / dose /+csf ? ? ? ?

## 2021-11-13 NOTE — Progress Notes (Signed)
Orthopedic Tech Progress Note ?Patient Details:  ?Donald [name] ?Feb 10, 1946 ?[id number] ? ?CPM Right Knee ?CPM Right Knee: On ?Right Knee Flexion (Degrees): 40 ?Right Knee Extension (Degrees): 10 ? ?Post Interventions ?Patient Tolerated: Well ?Instructions Provided: Care of device, Adjustment of device ? ?[name] ?11/13/2021, 12:07 PM ? ?

## 2021-11-13 NOTE — Anesthesia Postprocedure Evaluation (Signed)
Anesthesia Post Note ? ?Patient: NOACH CALVILLO ? ?Procedure(s) Performed: TOTAL KNEE ARTHROPLASTY (Right: Knee) ? ?  ? ?Patient location during evaluation: PACU ?Anesthesia Type: Regional and Spinal ?Level of consciousness: awake and alert ?Pain management: pain level controlled ?Vital Signs Assessment: post-procedure vital signs reviewed and stable ?Respiratory status: spontaneous breathing and respiratory function stable ?Cardiovascular status: blood pressure returned to baseline and stable ?Postop Assessment: spinal receding ?Anesthetic complications: no ? ? ?No notable events documented. ? ?Last Vitals:  ?Vitals:  ? 11/13/21 1339 11/13/21 1732  ?BP: 103/69 109/69  ?Pulse: 91 94  ?Resp: 16 18  ?Temp: 36.4 ?C 36.6 ?C  ?SpO2: 96% 90%  ?  ?Last Pain:  ?Vitals:  ? 11/13/21 1810  ?TempSrc:   ?PainSc: 7   ? ? ?  ?  ?  ?  ?  ?  ? ?Monte Zinni ? ? ? ? ?

## 2021-11-13 NOTE — Progress Notes (Signed)
Assisted Dr. Oddono with right, interscalene, ultrasound guided block. Side rails up, monitors on throughout procedure. See vital signs in flow sheet. Tolerated Procedure well. 

## 2021-11-13 NOTE — Transfer of Care (Signed)
Immediate Anesthesia Transfer of Care Note ? ?Patient: Donald [name] ? ?Procedure(s) Performed: TOTAL KNEE ARTHROPLASTY (Right: Knee) ? ?Patient Location: PACU ? ?Anesthesia Type:Spinal and MAC combined with regional for post-op pain ? ?Level of Consciousness: awake, alert , oriented and patient cooperative ? ?Airway & Oxygen Therapy: Patient Spontanous Breathing and Patient connected to face mask oxygen ? ?Post-op Assessment: Report given to RN and Post -op Vital signs reviewed and stable ? ?Post vital signs: Reviewed and stable ? ?Last Vitals:  ?Vitals Value Taken Time  ?BP 84/59 11/13/21 1154  ?Temp    ?Pulse 85 11/13/21 1156  ?Resp 16 11/13/21 1156  ?SpO2 99 % 11/13/21 1156  ?Vitals shown include unvalidated device data. ? ?Last Pain:  ?Vitals:  ? 11/13/21 0955  ?TempSrc:   ?PainSc: 0-No pain  ?   ? ?Patients Stated Pain Goal: 4 (11/13/21 0825) ? ?Complications: No notable events documented. ?

## 2021-11-13 NOTE — Evaluation (Signed)
Physical Therapy Evaluation ?Patient Details ?Name: Donald [name] ?MRN: [id number] ?DOB: 1946-07-23 ?Today's Date: 11/13/2021 ? ?History of Present Illness ? pt is a 76yo male presenting s/p R-TKA on 11/13/21. PMH: OA, depression, HLD, HTN, prostate cancer, L-TKA 2003 & 2007  ?Clinical Impression ? TOWNES FUHS is a 76 y.o. male POD 0 s/p R-TKA. Patient reports independence with mobility at baseline. Patient is now limited by functional impairments (see PT problem list below) and requires min guard for transfers and gait with RW. Patient was able to ambulate 30 feet with RW and min guard assist. Patient instructed in exercise to facilitate ROM and circulation to manage edema. Patient will benefit from continued skilled PT interventions to address impairments and progress towards PLOF. Acute PT will follow to progress mobility and stair training in preparation for safe discharge home. ?   ?   ? ?Recommendations for follow up therapy are one component of a multi-disciplinary discharge planning process, led by the attending physician.  Recommendations may be updated based on patient status, additional functional criteria and insurance authorization. ? ?Follow Up Recommendations Follow physician's recommendations for discharge plan and follow up therapies ? ?  ?Assistance Recommended at Discharge Set up Supervision/Assistance  ?Patient can return home with the following ? A little help with walking and/or transfers;A little help with bathing/dressing/bathroom;Assistance with cooking/housework;Assist for transportation;Help with stairs or ramp for entrance ? ?  ?Equipment Recommendations Rolling walker (2 wheels)  ?Recommendations for Other Services ?    ?  ?Functional Status Assessment Patient has had a recent decline in their functional status and demonstrates the ability to make significant improvements in function in a reasonable and predictable amount of time.  ? ?  ?Precautions / Restrictions  Precautions ?Precautions: None ?Restrictions ?Weight Bearing Restrictions: No ?Other Position/Activity Restrictions: WBAT  ? ?  ? ?Mobility ? Bed Mobility ?Overal bed mobility: Needs Assistance ?Bed Mobility: Supine to Sit ?  ?  ?Supine to sit: Supervision ?  ?  ?General bed mobility comments: Pt supervision for safety, no physical assist required. ?  ? ?Transfers ?Overall transfer level: Needs assistance ?Equipment used: Rolling walker (2 wheels) ?Transfers: Sit to/from Stand ?Sit to Stand: Min guard ?  ?  ?  ?  ?  ?General transfer comment: Pt min guard for safety only, no physical assist required. VCs for sequencing and hand placement. ?  ? ?Ambulation/Gait ?Ambulation/Gait assistance: Min guard ?Gait Distance (Feet): 30 Feet ?Assistive device: Rolling walker (2 wheels) ?Gait Pattern/deviations: Step-to pattern, Decreased stance time - right ?Gait velocity: decreased ?  ?  ?General Gait Details: Pt ambulated with RW and min guard assist, no physical assist required or overt LOB; recliner follow for safety. Demonstrated decreased stance time on R and step-to pattern ? ?Stairs ?  ?  ?  ?  ?  ? ?Wheelchair Mobility ?  ? ?Modified Rankin (Stroke Patients Only) ?  ? ?  ? ?Balance Overall balance assessment: Needs assistance ?Sitting-balance support: Feet supported, No upper extremity supported ?Sitting balance-Leahy Scale: Good ?  ?  ?Standing balance support: Bilateral upper extremity supported, During functional activity, Reliant on assistive device for balance ?Standing balance-Leahy Scale: Poor ?  ?  ?  ?  ?  ?  ?  ?  ?  ?  ?  ?  ?   ? ? ? ?Pertinent Vitals/Pain Pain Assessment ?Pain Assessment: 0-10 ?Pain Score: 4  ?Pain Location: R Knee ?Pain Descriptors / Indicators: Operative site guarding, Discomfort ?Pain Intervention(s): Limited activity  within patient's tolerance, Monitored during session, Repositioned  ? ? ?Home Living Family/patient expects to be discharged to:: Private residence ?Living Arrangements:  Spouse/significant other ?Available Help at Discharge: Family;Available 24 hours/day ?Type of Home: House ?Home Access: Stairs to enter ?Entrance Stairs-Rails: Right;Left ?Entrance Stairs-Number of Steps: 4 ?  ?Home Layout: Able to live on main level with bedroom/bathroom ?Home Equipment: None ?   ?  ?Prior Function Prior Level of Function : Independent/Modified Independent ?  ?  ?  ?  ?  ?  ?Mobility Comments: ind ?ADLs Comments: ind ?  ? ? ?Hand Dominance  ? Dominant Hand: Right ? ?  ?Extremity/Trunk Assessment  ? Upper Extremity Assessment ?Upper Extremity Assessment: Overall WFL for tasks assessed ?  ? ?Lower Extremity Assessment ?Lower Extremity Assessment: RLE deficits/detail;LLE deficits/detail ?RLE Deficits / Details: MMT ank pf/df 5/5, no extensor lag noted ?RLE Sensation: WNL ?LLE Deficits / Details: MMT ank pf/df 5/5 ?LLE Sensation: WNL ?  ? ?Cervical / Trunk Assessment ?Cervical / Trunk Assessment: Normal  ?Communication  ? Communication: No difficulties  ?Cognition Arousal/Alertness: Awake/alert ?Behavior During Therapy: [facility] for tasks assessed/performed ?Overall Cognitive Status: Within Functional Limits for tasks assessed ?  ?  ?  ?  ?  ?  ?  ?  ?  ?  ?  ?  ?  ?  ?  ?  ?  ?  ?  ? ?  ?General Comments   ? ?  ?Exercises Total Joint Exercises ?Ankle Circles/Pumps: AROM, 20 reps, Both  ? ?Assessment/Plan  ?  ?PT Assessment Patient needs continued PT services  ?PT Problem List Decreased strength;Decreased range of motion;Decreased activity tolerance;Decreased balance;Decreased mobility;Decreased coordination;Pain ? ?   ?  ?PT Treatment Interventions DME instruction;Gait training;Stair training;Functional mobility training;Therapeutic activities;Therapeutic exercise;Balance training;Neuromuscular re-education;Patient/family education   ? ?PT Goals (Current goals can be found in the Care Plan section)  ?Acute Rehab PT Goals ?Patient Stated Goal: Walk up and down stairs; eventually play golf ?PT Goal  Formulation: With patient ?Time For Goal Achievement: 11/20/21 ?Potential to Achieve Goals: Good ? ?  ?Frequency 7X/week ?  ? ? ?Co-evaluation   ?  ?  ?  ?  ? ? ?  ?AM-PAC PT "6 Clicks" Mobility  ?Outcome Measure Help needed turning from your back to your side while in a flat bed without using bedrails?: None ?Help needed moving from lying on your back to sitting on the side of a flat bed without using bedrails?: None ?Help needed moving to and from a bed to a chair (including a wheelchair)?: A Little ?Help needed standing up from a chair using your arms (e.g., wheelchair or bedside chair)?: A Little ?Help needed to walk in hospital room?: A Little ?Help needed climbing 3-5 steps with a railing? : A Little ?6 Click Score: 20 ? ?  ?End of Session Equipment Utilized During Treatment: Gait belt ?Activity Tolerance: Patient tolerated treatment well ?Patient left: in chair;with call bell/phone within reach;with chair alarm set;with SCD's reapplied ?Nurse Communication: Mobility status ?PT Visit Diagnosis: Pain;Difficulty in walking, not elsewhere classified (R26.2) ?Pain - Right/Left: Right ?Pain - part of body: Knee ?  ? ?Time: 2751-7001 ?PT Time Calculation (min) (ACUTE ONLY): 19 min ? ? ?Charges:   PT Evaluation ?$PT Eval Low Complexity: 1 Low ?  ?  ?   ? ? ?Coolidge Breeze, PT, DPT ?[facility] ?Office: [phone] ?Pager: [phone] ? ?Coolidge Breeze ?11/13/2021, 5:44 PM ? ?

## 2021-11-13 NOTE — Anesthesia Procedure Notes (Signed)
Procedure Name: Sevier ?Date/Time: 11/13/2021 10:05 AM ?Performed by: Lollie Sails, CRNA ?Pre-anesthesia Checklist: Patient identified, Emergency Drugs available, Suction available, Patient being monitored and Timeout performed ?Oxygen Delivery Method: Simple face mask ?Placement Confirmation: positive ETCO2 ? ? ? ? ?

## 2021-11-13 NOTE — Anesthesia Preprocedure Evaluation (Signed)
Anesthesia Evaluation  ?Patient identified by MRN, date of birth, ID band ?Patient awake ? ? ? ?Reviewed: ?Allergy & Precautions, H&P , NPO status , Patient's Chart, lab work & pertinent test results, reviewed documented beta blocker date and time  ? ?Airway ?Mallampati: II ? ?TM Distance: >3 FB ?Neck ROM: full ? ? ? Dental ?no notable dental hx. ? ?  ?Pulmonary ?neg pulmonary ROS, former smoker,  ?  ?Pulmonary exam normal ?breath sounds clear to auscultation ? ? ? ? ? ? Cardiovascular ?Exercise Tolerance: Good ?hypertension, Pt. on medications ? ?Rhythm:regular Rate:Normal ? ? ?  ?Neuro/Psych ?PSYCHIATRIC DISORDERS Depression negative neurological ROS ?   ? GI/Hepatic ?negative GI ROS, Neg liver ROS,   ?Endo/Other  ?negative endocrine ROS ? Renal/GU ?negative Renal ROS  ?negative genitourinary ?  ?Musculoskeletal ? ?(+) Arthritis , Osteoarthritis,   ? Abdominal ?  ?Peds ? Hematology ?negative hematology ROS ?(+)   ?Anesthesia Other Findings ? ? Reproductive/Obstetrics ?negative OB ROS ? ?  ? ? ? ? ? ? ? ? ? ? ? ? ? ?  ?  ? ? ? ? ? ? ? ? ?Anesthesia Physical ?Anesthesia Plan ? ?ASA: 2 ? ?Anesthesia Plan: MAC, Spinal and Regional  ? ?Post-op Pain Management: Regional block*  ? ?Induction:  ? ?PONV Risk Score and Plan: 1 ? ?Airway Management Planned: Nasal Cannula, Natural Airway and Simple Face Mask ? ?Additional Equipment: None ? ?Intra-op Plan:  ? ?Post-operative Plan:  ? ?Informed Consent: I have reviewed the patients History and Physical, chart, labs and discussed the procedure including the risks, benefits and alternatives for the proposed anesthesia with the patient or authorized representative who has indicated his/her understanding and acceptance.  ? ? ? ?Dental Advisory Given ? ?Plan Discussed with: CRNA and Anesthesiologist ? ?Anesthesia Plan Comments: ( ? ?)  ? ? ? ? ? ? ?Anesthesia Quick Evaluation ? ?

## 2021-11-13 NOTE — Discharge Instructions (Addendum)
? ?Gaynelle Arabian, MD ?Total Joint Specialist ?[facility] ?[address] ?[place], [place] [address] ?[phone] ? ?TOTAL KNEE REPLACEMENT POSTOPERATIVE DIRECTIONS ? ? ? ?Knee Rehabilitation, Guidelines Following Surgery  ?Results after knee surgery are often greatly improved when you follow the exercise, range of motion and muscle strengthening exercises prescribed by your doctor. Safety measures are also important to protect the knee from further injury. If any of these exercises cause you to have increased pain or swelling in your knee joint, decrease the amount until you are comfortable again and slowly increase them. If you have problems or questions, call your caregiver or physical therapist for advice.  ? ?BLOOD CLOT PREVENTION ?Take a 10 mg Xarelto once a day for three weeks following surgery. Then take an 81 mg Aspirin once a day for three weeks. Then discontinue Aspirin. ?You may resume your vitamins/supplements once you have discontinued the Xarelto. ?Do not take any NSAIDs (Advil, Aleve, Ibuprofen, Meloxicam, etc.) until you have discontinued the Xarelto.  ? ?HOME CARE INSTRUCTIONS  ?Remove items at home which could result in a fall. This includes throw rugs or furniture in walking pathways.  ?ICE to the affected knee as much as tolerated. Icing helps control swelling. If the swelling is well controlled you will be more comfortable and rehab easier. Continue to use ice on the knee for pain and swelling from surgery. You may notice swelling that will progress down to the foot and ankle. This is normal after surgery. Elevate the leg when you are not up walking on it.    ?Continue to use the breathing machine which will help keep your temperature down. It is common for your temperature to cycle up and down following surgery, especially at night when you are not up moving around and exerting yourself. The breathing machine keeps your lungs expanded and your temperature  down. ?Do not place pillow under the operative knee, focus on keeping the knee straight while resting ? ?DIET ?You may resume your previous home diet once you are discharged from the hospital. ? ?DRESSING / WOUND CARE / SHOWERING ?Keep your bulky bandage on for 2 days. On the third post-operative day you may remove the Ace bandage and gauze. There is a waterproof adhesive bandage on your skin which will stay in place until your first follow-up appointment. Once you remove this you will not need to place another bandage ?You may begin showering 3 days following surgery, but do not submerge the incision under water. ? ?ACTIVITY ?For the first 5 days, the key is rest and control of pain and swelling ?Do your home exercises twice a day starting on post-operative day 3. On the days you go to physical therapy, just do the home exercises once that day. ?You should rest, ice and elevate the leg for 50 minutes out of every hour. Get up and walk/stretch for 10 minutes per hour. After 5 days you can increase your activity slowly as tolerated. ?Walk with your walker as instructed. Use the walker until you are comfortable transitioning to a cane. Walk with the cane in the opposite hand of the operative leg. You may discontinue the cane once you are comfortable and walking steadily. ?Avoid periods of inactivity such as sitting longer than an hour when not asleep. This helps prevent blood clots.  ?You may discontinue the knee immobilizer once you are able to perform a straight leg raise while lying down. ?You may resume a sexual relationship in one month or  when given the OK by your doctor.  ?You may return to work once you are cleared by your doctor.  ?Do not drive a car for 6 weeks or until released by your surgeon.  ?Do not drive while taking narcotics. ? ?TED HOSE STOCKINGS ?Wear the elastic stockings on both legs for three weeks following surgery during the day. You may remove them at night for sleeping. ? ?WEIGHT  BEARING ?Weight bearing as tolerated with assist device (walker, cane, etc) as directed, use it as long as suggested by your surgeon or therapist, typically at least 4-6 weeks. ? ?POSTOPERATIVE CONSTIPATION PROTOCOL ?Constipation - defined medically as fewer than three stools per week and severe constipation as less than one stool per week. ? ?One of the most common issues patients have following surgery is constipation.  Even if you have a regular bowel pattern at home, your normal regimen is likely to be disrupted due to multiple reasons following surgery.  Combination of anesthesia, postoperative narcotics, change in appetite and fluid intake all can affect your bowels.  In order to avoid complications following surgery, here are some recommendations in order to help you during your recovery period. ? ?Colace (docusate) - Pick up an over-the-counter form of Colace or another stool softener and take twice a day as long as you are requiring postoperative pain medications.  Take with a full glass of water daily.  If you experience loose stools or diarrhea, hold the colace until you stool forms back up. If your symptoms do not get better within 1 week or if they get worse, check with your doctor. ?Dulcolax (bisacodyl) - Pick up over-the-counter and take as directed by the product packaging as needed to assist with the movement of your bowels.  Take with a full glass of water.  Use this product as needed if not relieved by Colace only.  ?MiraLax (polyethylene glycol) - Pick up over-the-counter to have on hand. MiraLax is a solution that will increase the amount of water in your bowels to assist with bowel movements.  Take as directed and can mix with a glass of water, juice, soda, coffee, or tea. Take if you go more than two days without a movement. Do not use MiraLax more than once per day. Call your doctor if you are still constipated or irregular after using this medication for 7 days in a row. ? ?If you continue  to have problems with postoperative constipation, please contact the office for further assistance and recommendations.  If you experience "the worst abdominal pain ever" or develop nausea or vomiting, please contact the office immediatly for further recommendations for treatment. ? ?ITCHING ?If you experience itching with your medications, try taking only a single pain pill, or even half a pain pill at a time.  You can also use Benadryl over the counter for itching or also to help with sleep.  ? ?MEDICATIONS ?See your medication summary on the ?After Visit Summary? that the nursing staff will review with you prior to discharge.  You may have some home medications which will be placed on hold until you complete the course of blood thinner medication.  It is important for you to complete the blood thinner medication as prescribed by your surgeon.  Continue your approved medications as instructed at time of discharge. ? ?PRECAUTIONS ?If you experience chest pain or shortness of breath - call 911 immediately for transfer to the hospital emergency department.  ?If you develop a fever greater that 101 F, purulent  drainage from wound, increased redness or drainage from wound, foul odor from the wound/dressing, or calf pain - CONTACT YOUR SURGEON.   ?                                                ?FOLLOW-UP APPOINTMENTS ?Make sure you keep all of your appointments after your operation with your surgeon and caregivers. You should call the office at the above phone number and make an appointment for approximately two weeks after the date of your surgery or on the date instructed by your surgeon outlined in the "After Visit Summary". ? ?RANGE OF MOTION AND STRENGTHENING EXERCISES  ?Rehabilitation of the knee is important following a knee injury or an operation. After just a few days of immobilization, the muscles of the thigh which control the knee become weakened and shrink (atrophy). Knee exercises are designed to build up  the tone and strength of the thigh muscles and to improve knee motion. Often times heat used for twenty to thirty minutes before working out will loosen up your tissues and help with improving the range of motion but do

## 2021-11-14 ENCOUNTER — Encounter (HOSPITAL_COMMUNITY): Payer: Self-pay | Admitting: Orthopedic Surgery

## 2021-11-14 DIAGNOSIS — M1711 Unilateral primary osteoarthritis, right knee: Secondary | ICD-10-CM | POA: Diagnosis not present

## 2021-11-14 LAB — BASIC METABOLIC PANEL
Anion gap: 8 (ref 5–15)
BUN: 20 mg/dL (ref 8–23)
CO2: 26 mmol/L (ref 22–32)
Calcium: 8.6 mg/dL — ABNORMAL LOW (ref 8.9–10.3)
Chloride: 103 mmol/L (ref 98–111)
Creatinine, Ser: 0.95 mg/dL (ref 0.61–1.24)
GFR, Estimated: >60 mL/min (ref >60)
Glucose, Bld: 152 mg/dL — ABNORMAL HIGH (ref 70–99)
Potassium: 3.8 mmol/L (ref 3.5–5.1)
Sodium: 137 mmol/L (ref 135–145)

## 2021-11-14 LAB — CBC
HCT: 33.6 % — ABNORMAL LOW (ref 39.0–52.0)
Hemoglobin: 11.2 g/dL — ABNORMAL LOW (ref 13.0–17.0)
MCH: 28.4 pg (ref 26.0–34.0)
MCHC: 33.3 g/dL (ref 30.0–36.0)
MCV: 85.3 fL (ref 80.0–100.0)
Platelets: 236 10*3/uL (ref 150–400)
RBC: 3.94 MIL/uL — ABNORMAL LOW (ref 4.22–5.81)
RDW: 13.6 % (ref 11.5–15.5)
WBC: 14.9 10*3/uL — ABNORMAL HIGH (ref 4.0–10.5)
nRBC: 0 % (ref 0.0–0.2)

## 2021-11-14 MED ORDER — METHOCARBAMOL 500 MG PO TABS: 500.0000 mg | ORAL_TABLET | Freq: Four times a day (QID) | ORAL | 0 refills | Status: DC | PRN

## 2021-11-14 MED ORDER — GABAPENTIN 300 MG PO CAPS: ORAL_CAPSULE | ORAL | 0 refills | Status: DC

## 2021-11-14 MED ORDER — OXYCODONE HCL 5 MG PO TABS: 5.0000 mg | ORAL_TABLET | Freq: Four times a day (QID) | ORAL | 0 refills | Status: DC | PRN

## 2021-11-14 MED ORDER — RIVAROXABAN 10 MG PO TABS: 10.0000 mg | ORAL_TABLET | Freq: Every day | ORAL | 0 refills | Status: AC

## 2021-11-14 NOTE — Progress Notes (Signed)
? ?  Subjective: ?1 Day Post-Op Procedure(s) (LRB): ?TOTAL KNEE ARTHROPLASTY (Right) ?Patient reports pain as mild.   ?Patient seen in rounds by Dr. Wynelle Link. ?Patient is well, and has had no acute complaints or problems No issues overnight. Denies chest pain, SOB, or calf pain. Foley catheter removed this AM.  ?We will continue therapy today, ambulated 30' yesterday.  ? ?Objective: ?Vital signs in last 24 hours: ?Temp:  [97.5 ?F (36.4 ?C)-98.2 ?F (36.8 ?C)] 97.7 ?F (36.5 ?C) (04/25 0516) ?Pulse Rate:  [72-96] 84 (04/25 0516) ?Resp:  [12-19] 14 (04/25 0516) ?BP: (81-141)/(54-75) 104/71 (04/25 0516) ?SpO2:  [90 %-100 %] 95 % (04/25 0516) ?Weight:  [86.5 kg] 86.5 kg (04/24 0825) ? ?Intake/Output from previous day: ? ?Intake/Output Summary (Last 24 hours) at 11/14/2021 0810 ?Last data filed at 11/14/2021 0600 ?Gross per 24 hour  ?Intake 3131.39 ml  ?Output 2475 ml  ?Net 656.39 ml  ?  ? ?Intake/Output this shift: ?No intake/output data recorded. ? ?Labs: ?Recent Labs  ?  11/14/21 ?0328  ?HGB 11.2*  ? ?Recent Labs  ?  11/14/21 ?0328  ?WBC 14.9*  ?RBC 3.94*  ?HCT 33.6*  ?PLT 236  ? ?Recent Labs  ?  11/14/21 ?0328  ?NA 137  ?K 3.8  ?CL 103  ?CO2 26  ?BUN 20  ?CREATININE 0.95  ?GLUCOSE 152*  ?CALCIUM 8.6*  ? ?No results for input(s): LABPT, INR in the last 72 hours. ? ?Exam: ?General - Patient is Alert and Oriented ?Extremity - Neurologically intact ?Neurovascular intact ?Sensation intact distally ?Dorsiflexion/Plantar flexion intact ?Dressing - dressing C/D/I ?Motor Function - intact, moving foot and toes well on exam.  ? ?Past Medical History:  ?Diagnosis Date  ? Allergy   ? Arthritis   ? thumbs, neck,  knees  ? Depression   ? Diverticulitis   ? Diverticulosis   ? History of kidney stones   ? Hyperlipidemia   ? Hypertension   ? Melanoma ([place])   ? Prostate cancer [facility]) 2009  ? seed implant  ? Tubular adenoma of colon   ? ? ?Assessment/Plan: ?1 Day Post-Op Procedure(s) (LRB): ?TOTAL KNEE ARTHROPLASTY (Right) ?Principal  Problem: ?  OA (osteoarthritis) of knee ?Active Problems: ?  Osteoarthritis of right knee ? ?Estimated body mass index is 28.98 kg/m? as calculated from the following: ?  Height as of this encounter: '5\' 8"'$  (1.727 m). ?  Weight as of this encounter: 86.5 kg. ?Advance diet ?Up with therapy ?D/C IV fluids ? ? ?Patient's anticipated LOS is less than 2 midnights, meeting these requirements: ?- Lives within 1 hour of care ?- Has a competent adult at home to recover with post-op recover ?- NO history of ? - Chronic pain requiring opioids ? - Diabetes ? - Coronary Artery Disease ? - Heart failure ? - Heart attack ? - Stroke ? - DVT/VTE ? - Cardiac arrhythmia ? - Respiratory Failure/COPD ? - Renal failure ? - Anemia ? - Advanced Liver disease ? ?DVT Prophylaxis - Xarelto ?Weight bearing as tolerated. ?Continue therapy. ? ?Plan is to go Home after hospital stay. ?Plan for discharge later today if progresses with therapy and meeting goals. ?Scheduled for OPPT at [facility]. ?Follow-up in the office in 2 weeks. ? ?The PDMP database was reviewed today prior to any opioid medications being prescribed to this patient. ? ?Theresa Duty, PA-C ?Orthopedic Surgery ?[phone] ?11/14/2021, 8:10 AM ? ?

## 2021-11-14 NOTE — Progress Notes (Signed)
Physical Therapy Treatment ?Patient Details ?Name: Donald [name] ?MRN: [id number] ?DOB: 11/26/1945 ?Today's Date: 11/14/2021 ? ? ?History of Present Illness pt is a 76yo male presenting s/p R-TKA on 11/13/21. PMH: OA, depression, HLD, HTN, prostate cancer, L-TKA 2003 & 2007 ? ?  ?PT Comments  ? ? Patient is making good progress with mobility and wife present for family training this date. Pt ambulated ~125' with RW and guarding from spouse. Pt completed stair mobility with side step technique and safe assist/guarding from spouse; cues and supervision provided by therapist for all mobility. EOS reviewed HEP for ROM, strength, and circulation. Addressed all questions in preparation for safe discharge home. Pt is mobilizing at safe level to return home with assist from wife. Will continue to progress in acute setting. ? ?  ?Recommendations for follow up therapy are one component of a multi-disciplinary discharge planning process, led by the attending physician.  Recommendations may be updated based on patient status, additional functional criteria and insurance authorization. ? ?Follow Up Recommendations ? Follow physician's recommendations for discharge plan and follow up therapies ?  ?  ?Assistance Recommended at Discharge Set up Supervision/Assistance  ?Patient can return home with the following A little help with walking and/or transfers;A little help with bathing/dressing/bathroom;Assistance with cooking/housework;Assist for transportation;Help with stairs or ramp for entrance ?  ?Equipment Recommendations ? Rolling walker (2 wheels)  ?  ?Recommendations for Other Services   ? ? ?  ?Precautions / Restrictions Precautions ?Precautions: None ?Restrictions ?Weight Bearing Restrictions: No ?Other Position/Activity Restrictions: WBAT  ?  ? ?Mobility ? Bed Mobility ?Overal bed mobility: Needs Assistance ?Bed Mobility: Supine to Sit ?  ?  ?Supine to sit: Supervision, HOB elevated ?  ?  ?General bed mobility comments: Pt  supervision for safety, no physical assist required. ?  ? ?Transfers ?Overall transfer level: Needs assistance ?Equipment used: Rolling walker (2 wheels) ?Transfers: Sit to/from Stand ?Sit to Stand: Supervision ?  ?  ?  ?  ?  ?General transfer comment: cues to use bil UE for power up, no assist needed to rise or sit. ?  ? ?Ambulation/Gait ?Ambulation/Gait assistance: Min guard, Supervision ?Gait Distance (Feet): 125 Feet ?Assistive device: Rolling walker (2 wheels) ?Gait Pattern/deviations: Step-to pattern, Decreased stance time - right ?Gait velocity: decreased ?  ?  ?General Gait Details: pt maintained safe proximity to RW, no LOB noted or buckling at Rt knee. pt's wife provided safe guarding with supervision from therapist. ? ? ?Stairs ?Stairs: Yes ?Stairs assistance: Min guard, Supervision ?Stair Management: One rail Right, Step to pattern, Sideways ?Number of Stairs: 3 ?General stair comments: cues for sequencing "up with good, down with bad" and safe guarding position for spouse to provide. No LOB noted. ? ? ?Wheelchair Mobility ?  ? ?Modified Rankin (Stroke Patients Only) ?  ? ? ?  ?Balance Overall balance assessment: Needs assistance ?Sitting-balance support: Feet supported, No upper extremity supported ?Sitting balance-Leahy Scale: Good ?  ?  ?Standing balance support: Bilateral upper extremity supported, During functional activity, Reliant on assistive device for balance ?Standing balance-Leahy Scale: Fair ?  ?  ?  ?  ?  ?  ?  ?  ?  ?  ?  ?  ?  ? ?  ?Cognition Arousal/Alertness: Awake/alert ?Behavior During Therapy: [facility] for tasks assessed/performed ?Overall Cognitive Status: Within Functional Limits for tasks assessed ?  ?  ?  ?  ?  ?  ?  ?  ?  ?  ?  ?  ?  ?  ?  ?  ?  ?  ?  ? ?  ?  Exercises Total Joint Exercises ?Ankle Circles/Pumps: AROM, Both, 15 reps ?Quad Sets: AROM, 5 reps, Seated, Right ?Short Arc Quad: AROM, 5 reps, Seated, Right ?Heel Slides: AROM, 5 reps, Seated, Right ?Hip ABduction/ADduction:  AROM, 5 reps, Seated, Right ?Straight Leg Raises: AROM, Seated, Right (2) ? ?  ?General Comments   ?  ?  ? ?Pertinent Vitals/Pain Pain Assessment ?Pain Location: R Knee ?Pain Descriptors / Indicators: Operative site guarding, Discomfort  ? ? ?Home Living   ?  ?  ?  ?  ?  ?  ?  ?  ?  ?   ?  ?Prior Function    ?  ?  ?   ? ?PT Goals (current goals can now be found in the care plan section) Acute Rehab PT Goals ?Patient Stated Goal: Walk up and down stairs; eventually play golf ?PT Goal Formulation: With patient ?Time For Goal Achievement: 11/20/21 ?Potential to Achieve Goals: Good ?Progress towards PT goals: Progressing toward goals ? ?  ?Frequency ? ? ? 7X/week ? ? ? ?  ?PT Plan    ? ? ?Co-evaluation   ?  ?  ?  ?  ? ?  ?AM-PAC PT "6 Clicks" Mobility   ?Outcome Measure ? Help needed turning from your back to your side while in a flat bed without using bedrails?: None ?Help needed moving from lying on your back to sitting on the side of a flat bed without using bedrails?: None ?Help needed moving to and from a bed to a chair (including a wheelchair)?: A Little ?Help needed standing up from a chair using your arms (e.g., wheelchair or bedside chair)?: A Little ?Help needed to walk in hospital room?: A Little ?Help needed climbing 3-5 steps with a railing? : A Little ?6 Click Score: 20 ? ?  ?End of Session Equipment Utilized During Treatment: Gait belt ?Activity Tolerance: Patient tolerated treatment well ?Patient left: in chair;with call bell/phone within reach;with chair alarm set;with SCD's reapplied ?Nurse Communication: Mobility status ?PT Visit Diagnosis: Pain;Difficulty in walking, not elsewhere classified (R26.2) ?Pain - Right/Left: Right ?Pain - part of body: Knee ?  ? ? ?Time: 4270-6237 ?PT Time Calculation (min) (ACUTE ONLY): 28 min ? ?Charges:  $Gait Training: 8-22 mins ?$Therapeutic Exercise: 8-22 mins          ?          ? ?Gwynneth Albright PT, DPT ?Acute Rehabilitation Services ?Office [phone] ?Pager  [phone]  ? ? ?Jacques Navy ?11/14/2021, 11:50 AM ? ?

## 2021-11-14 NOTE — TOC Transition Note (Signed)
Transition of Care (TOC) - CM/SW Discharge Note ? ?Patient Details  ?Name: Donald [name] ?MRN: [id number] ?Date of Birth: [birth year] ? ?Transition of Care (TOC) CM/SW Contact:  ?Sherie Don, LCSW ?Phone Number: ?11/14/2021, 9:41 AM ? ?Clinical Narrative: Patient is expected to discharge home after working with PT. CSW met with patient and his wife to confirm discharge plan and needs. Patient will go home with OPPT at [facility]. Patient will need a rolling walker, which [employer] delivered to patient's room. TOC signing off. ? ?Final next level of care: OP Rehab ?Barriers to Discharge: No Barriers Identified ? ?Patient Goals and CMS Choice ?Patient states their goals for this hospitalization and ongoing recovery are:: Discharge home with OPPT at [facility] ?CMS Medicare.gov Compare Post Acute Care list provided to:: Patient ?Choice offered to / list presented to : Patient ? ?Discharge Plan and Services         ?DME Arranged: Walker rolling ?DME Agency: [employer] ?Representative spoke with at DME Agency: Prearranged in orthopedist's office ? ?Readmission Risk Interventions ?   ? View : No data to display.  ?  ?  ?  ? ?

## 2021-11-17 NOTE — Discharge Summary (Signed)
Patient ID: ?Melburn Popper ?MRN: [id number] ?DOB/AGE: April 02, 1946 76 y.o. ? ?Admit date: 11/13/2021 ?Discharge date: 11/14/2021 ? ?Admission Diagnoses:  ?Principal Problem: ?  OA (osteoarthritis) of knee ?Active Problems: ?  Osteoarthritis of right knee ? ? ?Discharge Diagnoses:  ?Same ? ?Past Medical History:  ?Diagnosis Date  ? Allergy   ? Arthritis   ? thumbs, neck,  knees  ? Depression   ? Diverticulitis   ? Diverticulosis   ? History of kidney stones   ? Hyperlipidemia   ? Hypertension   ? Melanoma ([place])   ? Prostate cancer [facility]) 2009  ? seed implant  ? Tubular adenoma of colon   ? ? ?Surgeries: Procedure(s): ?TOTAL KNEE ARTHROPLASTY on 11/13/2021 ?  ?Consultants:  ? ?Discharged Condition: Improved ? ?Hospital Course: HURSHEL BOUILLON is an 76 y.o. male who was admitted 11/13/2021 for operative treatment ofOA (osteoarthritis) of knee. Patient has severe unremitting pain that affects sleep, daily activities, and work/hobbies. After pre-op clearance the patient was taken to the operating room on 11/13/2021 and underwent  Procedure(s): ?TOTAL KNEE ARTHROPLASTY.   ? ?Patient was given perioperative antibiotics:  ?Anti-infectives (From admission, onward)  ? ? Start     Dose/Rate Route Frequency Ordered Stop  ? 11/13/21 1600  ceFAZolin (ANCEF) IVPB 2g/100 mL premix       ? 2 g ?200 mL/hr over 30 Minutes Intravenous Every 6 hours 11/13/21 1344 11/13/21 2201  ? 11/13/21 0815  ceFAZolin (ANCEF) IVPB 2g/100 mL premix       ? 2 g ?200 mL/hr over 30 Minutes Intravenous On call to O.R. 11/13/21 0806 11/13/21 1034  ? ?  ?  ? ?Patient was given sequential compression devices, early ambulation, and chemoprophylaxis to prevent DVT. ? ?Patient benefited maximally from hospital stay and there were no complications.   ? ?Recent vital signs: No data found.  ? ?Recent laboratory studies: No results for input(s): WBC, HGB, HCT, PLT, NA, K, CL, CO2, BUN, CREATININE, GLUCOSE, INR, CALCIUM in the last 72 hours. ? ?Invalid input(s): PT,  2 ? ? ?Discharge Medications:   ?Allergies as of 11/14/2021   ?No Known Allergies ?  ? ?  ?Medication List  ?  ? ?STOP taking these medications   ? ?cholecalciferol 25 MCG (1000 UNIT) tablet ?Commonly known as: VITAMIN D3 ?  ?HYDROcodone-acetaminophen 7.5-325 MG tablet ?Commonly known as: NORCO ?  ? ?  ? ?TAKE these medications   ? ?amLODipine 10 MG tablet ?Commonly known as: NORVASC ?Take 10 mg by mouth daily. ?  ?atorvastatin 10 MG tablet ?Commonly known as: LIPITOR ?Take 10 mg by mouth daily. ?  ?benazepril 20 MG tablet ?Commonly known as: LOTENSIN ?Take 20 mg by mouth daily. ?  ?gabapentin 300 MG capsule ?Commonly known as: NEURONTIN ?Take a 300 mg capsule three times a day for two weeks following surgery.Then take a 300 mg capsule two times a day for two weeks. Then take a 300 mg capsule once a day for two weeks. Then discontinue. ?  ?methocarbamol 500 MG tablet ?Commonly known as: ROBAXIN ?Take 1 tablet (500 mg total) by mouth every 6 (six) hours as needed for muscle spasms. ?  ?oxyCODONE 5 MG immediate release tablet ?Commonly known as: Oxy IR/ROXICODONE ?Take 1-2 tablets (5-10 mg total) by mouth every 6 (six) hours as needed for moderate pain or severe pain. ?  ?rivaroxaban 10 MG Tabs tablet ?Commonly known as: XARELTO ?Take 1 tablet (10 mg total) by mouth daily with breakfast for 20 days.  Then take one 81 mg aspirin once a day for three weeks. Then discontinue aspirin. ?  ?tamsulosin 0.4 MG Caps capsule ?Commonly known as: FLOMAX ?Take 0.4 mg by mouth 2 (two) times daily. ?  ?triamterene-hydrochlorothiazide 37.5-25 MG tablet ?Commonly known as: MAXZIDE-25 ?Take 1 tablet by mouth daily. ?  ?venlafaxine XR 150 MG 24 hr capsule ?Commonly known as: EFFEXOR-XR ?Take 150 mg by mouth daily. ?  ?zolpidem 5 MG tablet ?Commonly known as: AMBIEN ?Take 5 mg by mouth daily. ?  ? ?  ? ?  ?  ? ? ?  ?Discharge Care Instructions  ?(From admission, onward)  ?  ? ? ?  ? ?  Start     Ordered  ? 11/14/21 0000  Weight bearing as  tolerated       ? 11/14/21 0813  ? 11/14/21 0000  Change dressing       ?Comments: You may remove the bulky bandage (ACE wrap and gauze) two days after surgery. You will have an adhesive waterproof bandage underneath. Leave this in place until your first follow-up appointment.  ? 11/14/21 0813  ? ?  ?  ? ?  ? ? ?Diagnostic Studies: No results found. ? ?Disposition: Discharge disposition: 01-Home or Self Care ? ? ? ? ? ? ?Discharge Instructions   ? ? Call MD / Call 911   Complete by: As directed ?  ? If you experience chest pain or shortness of breath, CALL 911 and be transported to the hospital emergency room.  If you develope a fever above 101 F, pus (white drainage) or increased drainage or redness at the wound, or calf pain, call your surgeon's office.  ? Change dressing   Complete by: As directed ?  ? You may remove the bulky bandage (ACE wrap and gauze) two days after surgery. You will have an adhesive waterproof bandage underneath. Leave this in place until your first follow-up appointment.  ? Constipation Prevention   Complete by: As directed ?  ? Drink plenty of fluids.  Prune juice may be helpful.  You may use a stool softener, such as Colace (over the counter) 100 mg twice a day.  Use MiraLax (over the counter) for constipation as needed.  ? Diet - low sodium heart healthy   Complete by: As directed ?  ? Do not put a pillow under the knee. Place it under the heel.   Complete by: As directed ?  ? Driving restrictions   Complete by: As directed ?  ? No driving for two weeks  ? Post-operative opioid taper instructions:   Complete by: As directed ?  ? POST-OPERATIVE OPIOID TAPER INSTRUCTIONS: ?It is important to wean off of your opioid medication as soon as possible. If you do not need pain medication after your surgery it is ok to stop day one. ?Opioids include: ?Codeine, Hydrocodone(Norco, Vicodin), Oxycodone(Percocet, oxycontin) and hydromorphone amongst others.  ?Long term and even short term use of opiods  can cause: ?Increased pain response ?Dependence ?Constipation ?Depression ?Respiratory depression ?And more.  ?Withdrawal symptoms can include ?Flu like symptoms ?Nausea, vomiting ?And more ?Techniques to manage these symptoms ?Hydrate well ?Eat regular healthy meals ?Stay active ?Use relaxation techniques(deep breathing, meditating, yoga) ?Do Not substitute Alcohol to help with tapering ?If you have been on opioids for less than two weeks and do not have pain than it is ok to stop all together.  ?Plan to wean off of opioids ?This plan should start within one week post op  of your joint replacement. ?Maintain the same interval or time between taking each dose and first decrease the dose.  ?Cut the total daily intake of opioids by one tablet each day ?Next start to increase the time between doses. ?The last dose that should be eliminated is the evening dose.  ? ?  ? TED hose   Complete by: As directed ?  ? Use stockings (TED hose) for three weeks on both leg(s).  You may remove them at night for sleeping.  ? Weight bearing as tolerated   Complete by: As directed ?  ? ?  ? ? ? Follow-up Information   ? ? Gaynelle Arabian, MD. Schedule an appointment as soon as possible for a visit in 2 week(s).   ?Specialty: Orthopedic Surgery ?Contact information: ?[address] ?[address] ?[place] [address] ?[phone] ? ? ?  ?  ? ?  ?  ? ?  ? ? ? ?Signed: ?Theresa Duty ?11/17/2021, 2:14 PM ? ? ? ?

## 2022-03-15 ENCOUNTER — Ambulatory Visit (HOSPITAL_COMMUNITY)
Admission: RE | Admit: 2022-03-15 | Discharge: 2022-03-15 | Disposition: A | Discharge status: Home / Self Care | Payer: Medicare Other | Source: Ambulatory Visit | Attending: Nurse Practitioner | Admitting: Nurse Practitioner

## 2022-03-15 ENCOUNTER — Encounter (HOSPITAL_COMMUNITY): Payer: Self-pay | Admitting: Nurse Practitioner

## 2022-03-15 VITALS — BP 124/74 | HR 106 | Ht 68.0 in | Wt 185.6 lb

## 2022-03-15 DIAGNOSIS — R9431 Abnormal electrocardiogram [ECG] [EKG]: Secondary | ICD-10-CM

## 2022-03-15 DIAGNOSIS — I444 Left anterior fascicular block: Secondary | ICD-10-CM | POA: Diagnosis not present

## 2022-03-15 DIAGNOSIS — I4510 Unspecified right bundle-branch block: Secondary | ICD-10-CM

## 2022-03-15 DIAGNOSIS — I471 Supraventricular tachycardia: Secondary | ICD-10-CM

## 2022-03-15 DIAGNOSIS — R000 Tachycardia, unspecified: Secondary | ICD-10-CM | POA: Insufficient documentation

## 2022-03-15 DIAGNOSIS — R002 Palpitations: Secondary | ICD-10-CM

## 2022-03-15 DIAGNOSIS — I452 Bifascicular block: Secondary | ICD-10-CM | POA: Diagnosis not present

## 2022-03-15 NOTE — Progress Notes (Signed)
Primary Care Physician: Crist Infante, MD Referring Physician: same   Donald [name] is a 76 y.o. male with a h/o recent rt knee repalcement in April of this year had f/u with PCP yesterday and described an episode of feeling sweaty ad weak while packing for a weekend trip. An ekg was done showed SR around 100 bpm, with   first degree AVB and RBBB. When ekg was faxed over yesterday the image was so poor was hard to determine presence of p waves but did look fairly regular. I confirmed SR today on our EKG with Dr. Curt Bears. We dd not feel there are any flutter waves or afib present. Pt has not had any further episodes. He did not check BP at time of episode. He had been very sedentary since his knee surgery. He states he had labs last week including thyroid and no significant abnormalities. His ekg in April for knee surgery looked very similar and read at that time SR with first degree AVB/RBBB/PVC"S at 100 bpm. He currently denies any exertional  chest pain or dyspnea.   Today, he denies symptoms of palpitations, chest pain, shortness of breath, orthopnea, PND, lower extremity edema, dizziness, presyncope, syncope, or neurologic sequela. The patient is tolerating medications without difficulties and is otherwise without complaint today.   Past Medical History:  Diagnosis Date   Allergy    Arthritis    thumbs, neck,  knees   Depression    Diverticulitis    Diverticulosis    History of kidney stones    Hyperlipidemia    Hypertension    Melanoma ([place])    Prostate cancer ([place]) 2009   seed implant   Tubular adenoma of colon    Past Surgical History:  Procedure Laterality Date   COLONOSCOPY     CYSTOSCOPY     EYE SURGERY     HEMORRHOID SURGERY     JOINT REPLACEMENT Left    knee   MELANOMA EXCISION     scapula,face, and right calf   POLYPECTOMY     PROSTATE SURGERY  2009   seed implant   TOTAL KNEE ARTHROPLASTY Left    left knee in 2003 and 2007   TOTAL KNEE ARTHROPLASTY Right  11/13/2021   Procedure: TOTAL KNEE ARTHROPLASTY;  Surgeon: Gaynelle Arabian, MD;  Location: [facility];  Service: Orthopedics;  Laterality: Right;    Current Outpatient Medications  Medication Sig Dispense Refill   amLODipine (NORVASC) 10 MG tablet Take 10 mg by mouth daily.     atorvastatin (LIPITOR) 10 MG tablet Take 10 mg by mouth daily.     benazepril (LOTENSIN) 20 MG tablet Take 20 mg by mouth daily.     HYDROcodone-acetaminophen (NORCO) 7.5-325 MG tablet Take 1 tablet by mouth every 6 (six) hours as needed.     Tamsulosin HCl (FLOMAX) 0.4 MG CAPS Take 0.4 mg by mouth 2 (two) times daily.     triamterene-hydrochlorothiazide (MAXZIDE-25) 37.5-25 MG tablet Take 1 tablet by mouth daily.     venlafaxine XR (EFFEXOR-XR) 150 MG 24 hr capsule Take 150 mg by mouth daily.     zolpidem (AMBIEN) 5 MG tablet Take 5 mg by mouth daily.     No current facility-administered medications for this encounter.    No Known Allergies  Social History   Socioeconomic History   Marital status: Married    Spouse name: Not on file   Number of children: 2   Years of education: Not on file   Highest  education level: Not on file  Occupational History   Occupation: retired  Tobacco Use   Smoking status: Former    Packs/day: 0.50    Years: 15.00    Total pack years: 7.50    Types: Cigarettes    Quit date: 11/27/1978    Years since quitting: 43.3   Smokeless tobacco: Former    Types: Chew    Quit date: 1992  Vaping Use   Vaping Use: Never used  Substance and Sexual Activity   Alcohol use: Yes    Comment: social- 2 drinks a month maybe    Drug use: Never   Sexual activity: Yes  Other Topics Concern   Not on file  Social History Narrative   Not on file   Social Determinants of Health   Financial Resource Strain: Not on file  Food Insecurity: Not on file  Transportation Needs: Not on file  Physical Activity: Not on file  Stress: Not on file  Social Connections: Not on file  Intimate Partner  Violence: Not on file    Family History  Problem Relation Age of Onset   Heart disease Mother    Heart disease Father    Prostate cancer Brother    Lung cancer Brother    Colon cancer Neg Hx    Colon polyps Neg Hx    Esophageal cancer Neg Hx    Rectal cancer Neg Hx    Stomach cancer Neg Hx    Liver cancer Neg Hx    Pancreatic cancer Neg Hx     ROS- All systems are reviewed and negative except as per the HPI above  Physical Exam: Vitals:   03/15/22 0924  BP: 124/74  Pulse: (!) 106  Weight: 84.2 kg  Height: '5\' 8"'$  (1.727 m)   Wt Readings from Last 3 Encounters:  03/15/22 84.2 kg  11/13/21 86.5 kg  11/02/21 86.5 kg    Labs: Lab Results  Component Value Date   NA 137 11/14/2021   K 3.8 11/14/2021   CL 103 11/14/2021   CO2 26 11/14/2021   GLUCOSE 152 (H) 11/14/2021   BUN 20 11/14/2021   CREATININE 0.95 11/14/2021   CALCIUM 8.6 (L) 11/14/2021   Lab Results  Component Value Date   INR 1.03 08/07/2010   No results found for: "CHOL", "HDL", "LDLCALC", "TRIG"   GEN- The patient is well appearing, alert and oriented x 3 today.   Head- normocephalic, atraumatic Eyes-  Sclera clear, conjunctiva pink Ears- hearing intact Oropharynx- clear Neck- supple, no JVP Lymph- no cervical lymphadenopathy Lungs- Clear to ausculation bilaterally, normal work of breathing Heart- Regular rate and rhythm, no murmurs, rubs or gallops, PMI not laterally displaced GI- soft, NT, ND, + BS Extremities- no clubbing, cyanosis, or edema MS- no significant deformity or atrophy Skin- no rash or lesion Psych- euthymic mood, full affect Neuro- strength and sensation are intact  EKG-Vent. rate 106 BPM PR interval 222 ms QRS duration 134 ms QT/QTcB 360/478 ms P-R-T axes 6 -54 27 Sinus tachycardia with 1st degree A-V block with Premature supraventricular complexes Right bundle branch block Left anterior fascicular block  Bifascicular block  Abnormal ECG When compared with ECG of  02-Nov-2021 13:25, PREVIOUS ECG IS PRESENT    Assessment and Plan:  1. SR with first degree AVB and RBBB with PC's  No evidence today to say he is in afib or flutter and his ekg looks very similar to ekg in April prior to knee surgery Will place a one  week Zio patch to further assess rhythm  Echo for abnormal ekg  I will notify of the results of the above and make appropriate f/u plans at that time   Butch Penny C. Tajae Maiolo, [facility] [address] [place], [place] [address] [phone]

## 2022-03-20 ENCOUNTER — Ambulatory Visit (HOSPITAL_COMMUNITY)
Admission: RE | Admit: 2022-03-20 | Discharge: 2022-03-20 | Disposition: A | Discharge status: Home / Self Care | Payer: Medicare Other | Source: Ambulatory Visit | Attending: Nurse Practitioner | Admitting: Nurse Practitioner

## 2022-03-20 ENCOUNTER — Encounter (HOSPITAL_COMMUNITY): Payer: Self-pay | Admitting: *Deleted

## 2022-03-20 DIAGNOSIS — I4510 Unspecified right bundle-branch block: Secondary | ICD-10-CM | POA: Diagnosis present

## 2022-03-20 DIAGNOSIS — Z87891 Personal history of nicotine dependence: Secondary | ICD-10-CM | POA: Diagnosis not present

## 2022-03-20 DIAGNOSIS — E785 Hyperlipidemia, unspecified: Secondary | ICD-10-CM | POA: Insufficient documentation

## 2022-03-20 DIAGNOSIS — I10 Essential (primary) hypertension: Secondary | ICD-10-CM | POA: Diagnosis not present

## 2022-03-20 LAB — ECHOCARDIOGRAM COMPLETE
Area-P 1/2: 4.39 cm2
S' Lateral: 3.8 cm

## 2022-04-09 ENCOUNTER — Other Ambulatory Visit (HOSPITAL_COMMUNITY): Payer: Self-pay | Admitting: *Deleted

## 2022-04-09 DIAGNOSIS — I471 Supraventricular tachycardia: Secondary | ICD-10-CM

## 2022-04-09 NOTE — Addendum Note (Signed)
Encounter addended by: Juluis Mire, RN on: 04/09/2022 9:00 AM  Actions taken: Visit diagnoses modified

## 2022-05-10 ENCOUNTER — Institutional Professional Consult (permissible substitution): Payer: Medicare Other | Admitting: Cardiovascular Disease

## 2022-05-14 ENCOUNTER — Ambulatory Visit: Payer: Medicare Other | Attending: Cardiovascular Disease | Admitting: Cardiovascular Disease

## 2022-05-14 ENCOUNTER — Encounter: Payer: Self-pay | Admitting: Cardiovascular Disease

## 2022-05-14 VITALS — BP 128/68 | HR 110 | Ht 68.0 in | Wt 185.6 lb

## 2022-05-14 DIAGNOSIS — I471 Supraventricular tachycardia, unspecified: Secondary | ICD-10-CM

## 2022-05-14 MED ORDER — METOPROLOL TARTRATE 25 MG PO TABS: 25.0000 mg | ORAL_TABLET | Freq: Two times a day (BID) | ORAL | 3 refills | Status: DC

## 2022-05-14 NOTE — Patient Instructions (Signed)
Medication Instructions:  Your physician has recommended you make the following change in your medication: 1) START taking metoprolol tartrate (Lopressor) 25 mg twice daily *If you need a refill on your cardiac medications before your next appointment, please call your pharmacy*  Follow-Up: At [facility], you and your health needs are our priority.  As part of our continuing mission to provide you with exceptional heart care, we have created designated Provider Care Teams.  These Care Teams include your primary Cardiologist (physician) and Advanced Practice Providers (APPs -  Physician Assistants and Nurse Practitioners) who all work together to provide you with the care you need, when you need it.  Your next appointment:   1 year(s)  The format for your next appointment:   In Person  Provider:   You may see Melida Quitter, MD or one of the following Advanced Practice Providers on your designated Care Team:   Tommye Standard, [place] Legrand Como "Jonni Sanger" Chalmers Cater, [place]    Important Information About Sugar

## 2022-05-14 NOTE — Progress Notes (Signed)
Cardiology Office Note:    Date:  05/14/2022   ID:  Melburn Popper, DOB 1945-10-21, MRN [id number]  PCP:  Crist Infante, [place] Providers Cardiologist:  None Electrophysiologist:  Melida Quitter, MD     Referring MD: Sherran Needs, NP   Chief complaint: here to discuss heart rhythm  History of Present Illness:    Donald [name] is a 76 y.o. male with a hx of HLD, HTN, prostate cancer referred for management of arrhythmia.  He was referred to AF clinic after ECG showed a rate of 100 bpm with RBBB. ECG at that visit again showed sinus rhythm at 100 bpm, RBB, and PVCs. A monitor was placed that showed HR 71-187 bpm, average 98. There was frequent supraventricular ectopy.   He reports that he is asymptomatic of the rhythm -- no palpitations, lightheadedness, chest discomfort, or shortness of breath.  Past Medical History:  Diagnosis Date   Allergy    Arthritis    thumbs, neck,  knees   Depression    Diverticulitis    Diverticulosis    History of kidney stones    Hyperlipidemia    Hypertension    Melanoma ([place])    Prostate cancer ([place]) 2009   seed implant   Tubular adenoma of colon     Past Surgical History:  Procedure Laterality Date   COLONOSCOPY     CYSTOSCOPY     EYE SURGERY     HEMORRHOID SURGERY     JOINT REPLACEMENT Left    knee   MELANOMA EXCISION     scapula,face, and right calf   POLYPECTOMY     PROSTATE SURGERY  2009   seed implant   TOTAL KNEE ARTHROPLASTY Left    left knee in 2003 and 2007   TOTAL KNEE ARTHROPLASTY Right 11/13/2021   Procedure: TOTAL KNEE ARTHROPLASTY;  Surgeon: Gaynelle Arabian, MD;  Location: [facility];  Service: Orthopedics;  Laterality: Right;    Current Medications: Current Meds  Medication Sig   amLODipine (NORVASC) 10 MG tablet Take 10 mg by mouth daily.   atorvastatin (LIPITOR) 10 MG tablet Take 10 mg by mouth daily.   benazepril (LOTENSIN) 20 MG tablet Take 20 mg by mouth daily.    HYDROcodone-acetaminophen (NORCO) 7.5-325 MG tablet Take 1 tablet by mouth every 6 (six) hours as needed.   metoprolol tartrate (LOPRESSOR) 25 MG tablet Take 1 tablet (25 mg total) by mouth 2 (two) times daily.   Tamsulosin HCl (FLOMAX) 0.4 MG CAPS Take 0.4 mg by mouth 2 (two) times daily.   triamterene-hydrochlorothiazide (MAXZIDE-25) 37.5-25 MG tablet Take 1 tablet by mouth daily.   venlafaxine XR (EFFEXOR-XR) 150 MG 24 hr capsule Take 150 mg by mouth daily.   zolpidem (AMBIEN) 5 MG tablet Take 5 mg by mouth daily.     Allergies:   Patient has no known allergies.   Social History   Socioeconomic History   Marital status: Married    Spouse name: Not on file   Number of children: 2   Years of education: Not on file   Highest education level: Not on file  Occupational History   Occupation: retired  Tobacco Use   Smoking status: Former    Packs/day: 0.50    Years: 15.00    Total pack years: 7.50    Types: Cigarettes    Quit date: 11/27/1978    Years since quitting: 43.4   Smokeless tobacco: Former    Types: Loss adjuster, chartered  Quit date: 67  Vaping Use   Vaping Use: Never used  Substance and Sexual Activity   Alcohol use: Yes    Comment: social- 2 drinks a month maybe    Drug use: Never   Sexual activity: Yes  Other Topics Concern   Not on file  Social History Narrative   Not on file   Social Determinants of Health   Financial Resource Strain: Not on file  Food Insecurity: Not on file  Transportation Needs: Not on file  Physical Activity: Not on file  Stress: Not on file  Social Connections: Not on file     Family History: The patient's family history includes Heart disease in his father and mother; Lung cancer in his brother; Prostate cancer in his brother. There is no history of Colon cancer, Colon polyps, Esophageal cancer, Rectal cancer, Stomach cancer, Liver cancer, or Pancreatic cancer.  ROS:   Please see the history of present illness.    All other systems reviewed  and are negative.  EKGs/Labs/Other Studies Reviewed:     Monitor 03/2022 Predominant rhythm was sinus rhythm 1 run of ventricular tachycardia, 20 beats at 176 bpm 4630 SVT episodes, all less than 2 minutes - some may be atrial flutter with 2:1 and variable conduction. 9.8% supraventricular ectopy 2.4% ventricular ectopy No triggered episodes recorded  TTE 03/20/2022 EF 55-60% normal left atrial size  EKG:  Last EKG results: Sinus tachycardia with frequent PVCs, RBBB   Recent Labs: 11/02/2021: ALT 16 11/14/2021: BUN 20; Creatinine, Ser 0.95; Hemoglobin 11.2; Platelets 236; Potassium 3.8; Sodium 137              Physical Exam:    VS:  BP 128/68   Pulse (!) 110   Ht '5\' 8"'$  (1.727 m)   Wt 185 lb 9.6 oz (84.2 kg)   BMI 28.22 kg/m     Wt Readings from Last 3 Encounters:  05/14/22 185 lb 9.6 oz (84.2 kg)  03/15/22 185 lb 9.6 oz (84.2 kg)  11/13/21 190 lb 9.6 oz (86.5 kg)     GEN: Well nourished, well developed in no acute distress CARDIAC: RRR, no murmurs, rubs, gallops RESPIRATORY:  Normal work of breathing MUSCULOSKELETAL: no edema    ASSESSMENT & PLAN:    SVT: Irregular tachycardia detected on monitor. ECG today shows likely sinus tachycardia with frequent PVCs. He is asymptomatic. We discussed his diagnosis, prognosis, and management options. He is asymptomatic, so I would regard this as an incidental finding. His apple watch is set up to monitor for AF. Given his burden of ectopy, he is likely at increased risk for developing AF. I informed him that AF can cause strokes, and he should inform us if his device alerts him that he is having an AF episode. I'll start metoprolol '25mg'$  BID Frequent ectopy, atrial and ventricular          Medication Adjustments/Labs and Tests Ordered: Current medicines are reviewed at length with the patient today.  Concerns regarding medicines are outlined above.  Orders Placed This Encounter  Procedures   EKG 12-Lead   Meds ordered  this encounter  Medications   metoprolol tartrate (LOPRESSOR) 25 MG tablet    Sig: Take 1 tablet (25 mg total) by mouth 2 (two) times daily.    Dispense:  180 tablet    Refill:  3     Signed, Melida Quitter, MD  05/14/2022 2:11 PM    [place]

## 2022-08-30 ENCOUNTER — Encounter (HOSPITAL_COMMUNITY): Payer: Self-pay | Admitting: *Deleted

## 2023-05-13 ENCOUNTER — Ambulatory Visit: Payer: Medicare Other | Admitting: Cardiovascular Disease

## 2023-05-14 ENCOUNTER — Other Ambulatory Visit: Payer: Self-pay | Admitting: Cardiovascular Disease

## 2023-06-02 NOTE — Progress Notes (Unsigned)
  Cardiology Office Note:  .   Date:  06/02/2023  ID:  Donald [name], DOB 1946-03-30, MRN [id number] PCP: Rodrigo Ran, MD  [facility] Providers Cardiologist:  None Electrophysiologist:  Maurice Small, MD {  History of Present Illness: .   Donald [name] is a 77 y.o. male w/PMHx of HTN, HLD, RBBB  He saw Dr. Nelly Laurence 05/14/22, referred after a monitor that noted HR 71-187 bpm, average 98. There was frequent supraventricular ectopy  Discussed: SVT: Irregular tachycardia detected on monitor. ECG today shows likely sinus tachycardia with frequent PVCs. He is asymptomatic. We discussed his diagnosis, prognosis, and management options. He is asymptomatic, so I would regard this as an incidental finding. His apple watch is set up to monitor for AF. Given his burden of ectopy, he is likely at increased risk for developing AF. I informed him that AF can cause strokes, and he should inform us if his device alerts him that he is having an AF episode. I'll start metoprolol 25mg  BID   Today's visit is scheduled as an annual visit  ROS: ***  *** symptoms *** triggers?  Arrhythmia/AAD hx PACs, PVCs frequent   Studies Reviewed: Marland Kitchen    EKG done today and reviewed by myself:  ***   Monitor 03/2022 Predominant rhythm was sinus rhythm 1 run of ventricular tachycardia, 20 beats at 176 bpm 4630 SVT episodes, all less than 2 minutes - some may be atrial flutter with 2:1 and variable conduction. 9.8% supraventricular ectopy 2.4% ventricular ectopy No triggered episodes recorded   TTE 03/20/2022 EF 55-60% normal left atrial size   Risk Assessment/Calculations:    Physical Exam:   VS:  There were no vitals taken for this visit.   Wt Readings from Last 3 Encounters:  05/14/22 185 lb 9.6 oz (84.2 kg)  03/15/22 185 lb 9.6 oz (84.2 kg)  11/13/21 190 lb 9.6 oz (86.5 kg)    GEN: Well nourished, well developed in no acute distress NECK: No JVD; No carotid bruits CARDIAC: ***RRR, no  murmurs, rubs, gallops RESPIRATORY:  *** CTA b/l without rales, wheezing or rhonchi  ABDOMEN: Soft, non-tender, non-distended EXTREMITIES:  No edema; No deformity   ASSESSMENT AND PLAN: .    PACs PVCs *** on metoprolol  HTN ***      {Are you ordering a CV Procedure (e.g. stress test, cath, DCCV, TEE, etc)?   Press F2        :[id number]}     Dispo: ***  Signed, Sheilah Pigeon, PA-C

## 2023-06-03 ENCOUNTER — Encounter: Payer: Self-pay | Admitting: Physician Assistant

## 2023-06-03 ENCOUNTER — Ambulatory Visit: Payer: Medicare Other | Attending: Cardiovascular Disease | Admitting: Physician Assistant

## 2023-06-03 VITALS — BP 88/60 | HR 55 | Ht 68.0 in | Wt 193.8 lb

## 2023-06-03 DIAGNOSIS — I491 Atrial premature depolarization: Secondary | ICD-10-CM

## 2023-06-03 DIAGNOSIS — I10 Essential (primary) hypertension: Secondary | ICD-10-CM | POA: Diagnosis not present

## 2023-06-03 DIAGNOSIS — I471 Supraventricular tachycardia, unspecified: Secondary | ICD-10-CM

## 2023-06-03 DIAGNOSIS — I493 Ventricular premature depolarization: Secondary | ICD-10-CM | POA: Diagnosis not present

## 2023-06-03 MED ORDER — AMLODIPINE BESYLATE 5 MG PO TABS: 5.0000 mg | ORAL_TABLET | Freq: Every day | ORAL | 1 refills | Status: AC

## 2023-06-03 NOTE — Patient Instructions (Signed)
Medication Instructions:  Your physician has recommended you make the following change in your medication:   1) DECREASE amlodipine to 5 mg daily  *If you need a refill on your cardiac medications before your next appointment, please call your pharmacy*  Lab Work: None ordered today.  Testing/Procedures: None ordered today.  Follow-Up: At [facility], you and your health needs are our priority.  As part of our continuing mission to provide you with exceptional heart care, we have created designated Provider Care Teams.  These Care Teams include your primary Cardiologist (physician) and Advanced Practice Providers (APPs -  Physician Assistants and Nurse Practitioners) who all work together to provide you with the care you need, when you need it.  Your next appointment:   3 week(s) Nurse Visit for BP Check (bring your home blood pressure cuff) 1 year with Dr. Nelly Laurence or Francis Dowse, PA-C  The format for your next appointment:   In Person  Provider:   You may see Maurice Small, MD or one of the following Advanced Practice Providers on your designated Care Team:   Francis Dowse, [place]  Other Instructions HOW TO TAKE YOUR BLOOD PRESSURE Rest 5 minutes before taking your blood pressure. Don't  smoke or drink caffeinated beverages for at least 30 minutes before. Take your blood pressure before (not after) you eat. Sit comfortably with your back supported and both feet on the floor ( don't cross your legs). Elevate your arm to heart level on a table or a desk. Use the proper sized cuff.  It should fit smoothly and snugly around your bare upper arm. There should be enough room to slip a fingertip under the cuff.  The bottom edge of the cuff should be 1 inch above the crease of the elbow.  Please monitor your blood pressure once daily 2 hours after your am medication. If you blood pressure consistently remains above 140 (systolic) top number or over 90 ( diastolic) bottom number X 3 days  consecutively.  Please call our office at [phone] or send Mychart message.

## 2023-06-24 ENCOUNTER — Ambulatory Visit: Payer: Medicare Other | Attending: Cardiology | Admitting: *Deleted

## 2023-06-24 DIAGNOSIS — I10 Essential (primary) hypertension: Secondary | ICD-10-CM

## 2023-06-24 NOTE — Progress Notes (Signed)
   Nurse Visit   Date of Encounter: 06/24/2023 ID: Donald [name], DOB 07/02/1946, MRN [id number]  PCP:  Rodrigo Ran, MD   [facility] Providers Cardiologist:  None Electrophysiologist:  Maurice Small, MD      Visit Details   VS:  BP 118/73 (BP Location: Left Arm) Comment (BP Location): pt's personal cuff , BMI There is no height or weight on file to calculate BMI.  Wt Readings from Last 3 Encounters:  06/03/23 193 lb 12.8 oz (87.9 kg)  05/14/22 185 lb 9.6 oz (84.2 kg)  03/15/22 185 lb 9.6 oz (84.2 kg)     Reason for visit: BP check Performed today: Vitals, Provider consulted:Dr. Jacinto Halim (DOD), and Education Changes (medications, testing, etc.) : No Length of Visit: 15 minutes  Reviewed w/ DOD, Dr. Jacinto Halim.  Patient advised to follow up with PCP for further advisement, but to discuss stopping Amlodipine while taking Lopressor (started in October for SVT).  Patient reports BPs at home aren't too low but averaging what we say today - SBP low 100s - 110s.  Patient is not symptomatic when SBP is in the low 100s, he will discuss further w/ PCP.  Medications Adjustments/Labs and Tests Ordered: No orders of the defined types were placed in this encounter.  No orders of the defined types were placed in this encounter.    Signed, Baird Lyons, RN  06/24/2023 3:35 PM

## 2023-07-02 ENCOUNTER — Other Ambulatory Visit: Payer: Self-pay | Admitting: Cardiovascular Disease

## 2023-08-16 ENCOUNTER — Other Ambulatory Visit: Payer: Self-pay | Admitting: *Deleted

## 2023-08-16 DIAGNOSIS — J929 Pleural plaque without asbestos: Secondary | ICD-10-CM

## 2023-08-19 ENCOUNTER — Encounter: Payer: Self-pay | Admitting: Emergency Medicine

## 2023-10-03 ENCOUNTER — Encounter (HOSPITAL_BASED_OUTPATIENT_CLINIC_OR_DEPARTMENT_OTHER): Payer: Medicare Other

## 2024-01-03 ENCOUNTER — Telehealth: Payer: Self-pay | Admitting: Pharmacy Technician

## 2024-01-03 NOTE — Telephone Encounter (Signed)
 Auth Submission: APPROVED Site of care: Site of care: [facility] Payer: [employer] MEDICARE Medication & CPT/J Code(s) submitted: Prolia (Denosumab) [id number] Diagnosis Code:  Route of submission (phone, fax, portal):  Phone # Fax # Auth type: Buy/Bill HB Units/visits requested: 60MG  Q6M X 2 DOSES Reference number: [id number] Approval from: 01/03/24 to 01/02/25

## 2024-01-17 ENCOUNTER — Other Ambulatory Visit (HOSPITAL_COMMUNITY): Payer: Self-pay | Admitting: *Deleted

## 2024-01-20 ENCOUNTER — Ambulatory Visit (HOSPITAL_COMMUNITY)
Admission: RE | Admit: 2024-01-20 | Discharge: 2024-01-20 | Disposition: A | Discharge status: Home / Self Care | Source: Ambulatory Visit | Attending: Internal Medicine | Admitting: Internal Medicine

## 2024-01-20 DIAGNOSIS — M8580 Other specified disorders of bone density and structure, unspecified site: Secondary | ICD-10-CM | POA: Diagnosis present

## 2024-01-20 MED ORDER — DENOSUMAB 60 MG/ML ~~LOC~~ SOSY
PREFILLED_SYRINGE | SUBCUTANEOUS | Status: AC
  Filled 2024-01-20: qty 1

## 2024-01-20 MED ORDER — DENOSUMAB 60 MG/ML ~~LOC~~ SOSY
60.0000 mg | PREFILLED_SYRINGE | Freq: Once | SUBCUTANEOUS | Status: AC
  Administered 2024-01-20: 60 mg via SUBCUTANEOUS

## 2024-01-21 ENCOUNTER — Encounter (HOSPITAL_COMMUNITY)

## 2024-06-15 ENCOUNTER — Encounter: Payer: Self-pay | Admitting: Cardiology

## 2024-06-15 ENCOUNTER — Ambulatory Visit: Attending: Physician Assistant | Admitting: Cardiology

## 2024-06-15 VITALS — BP 110/72 | HR 64 | Ht 68.0 in | Wt 188.0 lb

## 2024-06-15 DIAGNOSIS — I471 Supraventricular tachycardia, unspecified: Secondary | ICD-10-CM | POA: Diagnosis not present

## 2024-06-15 DIAGNOSIS — I493 Ventricular premature depolarization: Secondary | ICD-10-CM | POA: Diagnosis not present

## 2024-06-15 DIAGNOSIS — I10 Essential (primary) hypertension: Secondary | ICD-10-CM

## 2024-06-15 MED ORDER — METOPROLOL TARTRATE 25 MG PO TABS: 25.0000 mg | ORAL_TABLET | Freq: Two times a day (BID) | ORAL | 3 refills | Status: AC

## 2024-06-15 NOTE — Patient Instructions (Signed)
 Medication Instructions:   Your physician recommends that you continue on your current medications as directed. Please refer to the Current Medication list given to you today.   *If you need a refill on your cardiac medications before your next appointment, please call your pharmacy*    Lab Work: NONE ORDERED  TODAY    If you have labs (blood work) drawn today and your tests are completely normal, you will receive your results only by: MyChart Message (if you have MyChart) OR A paper copy in the mail If you have any lab test that is abnormal or we need to change your treatment, we will call you to review the results.    Testing/Procedures: NONE ORDERED  TODAY    Follow-Up: At [facility], you and your health needs are our priority.  As part of our continuing mission to provide you with exceptional heart care, our providers are all part of one team.  This team includes your primary Cardiologist (physician) and Advanced Practice Providers or APPs (Physician Assistants and Nurse Practitioners) who all work together to provide you with the care you need, when you need it.  Your next appointment:    6 month(s)   Provider:    You may see Eulas FORBES Furbish, MD or one of the following Advanced Practice Providers on your designated Care Team:   Charlies Arthur, PA-C Michael Andy Tillery, PA-C Brandi Ollis, NP Artist Pouch, PA-C        DO NOT delete brackets or number arounWe recommend signing up for the patient portal called MyChart.  Sign up information is provided on this After Visit Summary.  MyChart is used to connect with patients for Virtual Visits (Telemedicine).  Patients are able to view lab/test results, encounter notes, upcoming appointments, etc.  Non-urgent messages can be sent to your provider as well.   To learn more about what you can do with MyChart, go to [email or web address].   Other Instructions

## 2024-06-15 NOTE — Progress Notes (Signed)
  Electrophysiology Office Note:   Date:  06/15/2024  ID:  Donald [name], DOB 1945-11-12, MRN [id number]  Primary Cardiologist: None Electrophysiologist: Eulas FORBES Furbish, MD   Electrophysiologist:  Eulas FORBES Furbish, MD      History of Present Illness:   Donald [name] is a 78 y.o. male with h/o hypertension, SVT, PVCs, RBBB, hyperlipidemia seen today for routine electrophysiology followup.   Since last being seen in our clinic the patient reports doing well.  Today patient reports that he has only been taking his Metoprolol  once daily rather than twice, believes that this change may have been made by his PCP. Despite only taking once per day, has not had significant increase in palpitations/elevated HR, though admits that he has never had significant symptoms. He denies chest pain, palpitations, dyspnea, PND, orthopnea, nausea, vomiting, dizziness, syncope, edema, weight gain, or early satiety.   Review of systems complete and found to be negative unless listed in HPI.   EP Information / Studies Reviewed:    EKG is ordered today. Personal review as below.  EKG Interpretation Date/Time:  Monday June 15 2024 15:41:24 EST Ventricular Rate:  65 PR Interval:  266 QRS Duration:  134 QT Interval:  428 QTC Calculation: 445 R Axis:   -48  Text Interpretation: Sinus rhythm Trifascicular block When compared with ECG of 15-Jun-2024 15:39, No significant change was found Confirmed by Trudy Birmingham [id number]) on 06/15/2024 4:03:51 PM    Arrhythmia/Device History No specialty comments available.   Physical Exam:   VS:  BP 110/72   Pulse 64   Ht 5' 8 (1.727 m)   Wt 188 lb (85.3 kg)   SpO2 94%   BMI 28.59 kg/m    Wt Readings from Last 3 Encounters:  06/15/24 188 lb (85.3 kg)  06/03/23 193 lb 12.8 oz (87.9 kg)  05/14/22 185 lb 9.6 oz (84.2 kg)     GEN: No acute distress NECK: No JVD; No carotid bruits CARDIAC: Regular rate and rhythm, no murmurs, rubs, gallops RESPIRATORY:   Clear to auscultation without rales, wheezing or rhonchi  ABDOMEN: Soft, non-tender, non-distended EXTREMITIES:  No edema; No deformity   ASSESSMENT AND PLAN:    SVT PVCs Trifascicular block (noted prior to BB) ECG today with overall stable intervals. Patient without symptoms of SVT or bradycardia. No ectopy on physical exam or ECG. Will have patient go back to taking his Metoprolol  Tartrate 25mg  BID. Because of this change and trifascicular block, will bring him back for ECG and f/u in 6 months.  Hypertension BP well controlled. Continue follow up with PCP.      Follow up with EP Team in 6 months  Signed, [place] Trudy, PA-C

## 2024-07-27 ENCOUNTER — Ambulatory Visit (HOSPITAL_BASED_OUTPATIENT_CLINIC_OR_DEPARTMENT_OTHER)

## 2024-07-27 DIAGNOSIS — J929 Pleural plaque without asbestos: Secondary | ICD-10-CM

## 2024-07-27 LAB — PULMONARY FUNCTION TEST
DL/VA % pred: 87 %
DL/VA: 3.46 ml/min/mmHg/L
DLCO unc % pred: 80 %
DLCO unc: 19.37 ml/min/mmHg
FEF 25-75 Post: 3.62 L/s
FEF 25-75 Pre: 3.12 L/s
FEF2575-%Change-Post: 15 %
FEF2575-%Pred-Post: 182 %
FEF2575-%Pred-Pre: 157 %
FEV1-%Change-Post: 3 %
FEV1-%Pred-Post: 111 %
FEV1-%Pred-Pre: 108 %
FEV1-Post: 3.14 L
FEV1-Pre: 3.05 L
FEV1FVC-%Change-Post: 0 %
FEV1FVC-%Pred-Pre: 111 %
FEV6-%Change-Post: 3 %
FEV6-%Pred-Post: 105 %
FEV6-%Pred-Pre: 102 %
FEV6-Post: 3.89 L
FEV6-Pre: 3.78 L
FEV6FVC-%Pred-Post: 107 %
FEV6FVC-%Pred-Pre: 107 %
FVC-%Change-Post: 2 %
FVC-%Pred-Post: 98 %
FVC-%Pred-Pre: 96 %
FVC-Post: 3.89 L
FVC-Pre: 3.79 L
Post FEV1/FVC ratio: 81 %
Post FEV6/FVC ratio: 100 %
Pre FEV1/FVC ratio: 80 %
Pre FEV6/FVC Ratio: 100 %
RV % pred: 110 %
RV: 2.84 L
TLC % pred: 98 %
TLC: 6.72 L

## 2024-07-27 NOTE — Patient Instructions (Signed)
 Full PFT performed today.

## 2024-07-27 NOTE — Progress Notes (Signed)
 Full PFT performed today.

## 2024-08-06 ENCOUNTER — Ambulatory Visit: Admitting: Emergency Medicine

## 2024-08-06 ENCOUNTER — Encounter: Payer: Self-pay | Admitting: Emergency Medicine

## 2024-08-06 VITALS — BP 118/84 | HR 76 | Ht 68.0 in | Wt 191.0 lb

## 2024-08-06 DIAGNOSIS — Z77090 Contact with and (suspected) exposure to asbestos: Secondary | ICD-10-CM

## 2024-08-06 DIAGNOSIS — J920 Pleural plaque with presence of asbestos: Secondary | ICD-10-CM

## 2024-08-06 DIAGNOSIS — J929 Pleural plaque without asbestos: Secondary | ICD-10-CM | POA: Diagnosis not present

## 2024-08-06 DIAGNOSIS — Z87891 Personal history of nicotine dependence: Secondary | ICD-10-CM | POA: Diagnosis not present

## 2024-08-06 NOTE — Progress Notes (Signed)
 "  Subjective:    Patient ID: Donald [name], male    DOB: 1945-07-29, 79 y.o.   MRN: [id number]  HPI Donald [name] is a 79 year old man with a history of former tobacco use (10 pack years), hypertension, prostate cancer, melanoma, hyperlipidemia.  He has an occupational asbestos exposure with pleural plaquing that was first noted on chest x-rays as far back as 2002. 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  He presents today to reestablish care.  Last seen here in 02/2019 at which time we had followed stable pleural plaques and calcifications as well as a stable 5 mm lingular nodule.  No breathing limitations. No cough.   He had repeat pulmonary function testing done 07/27/2024 which I have reviewed and which show normal airflows without a bronchodilator response.  His lung volumes are normal, diffusing capacity is normal.  His FEV1 and FVC have not changed compared with 7 years ago.  His total lung capacity is stable as well.   Review of Systems As per HPI  Past Medical History:  Diagnosis Date   Allergy    Arthritis    thumbs, neck,  knees   Depression    Diverticulitis    Diverticulosis    History of kidney stones    Hyperlipidemia    Hypertension    Melanoma (HCC)    Prostate cancer (HCC) 2009   seed implant   Tubular adenoma of colon      Family History  Problem Relation Age of Onset   Heart disease Mother    Heart disease Father    Prostate cancer Brother    Lung cancer Brother    Colon cancer Neg Hx    Colon polyps Neg Hx    Esophageal cancer Neg Hx    Rectal cancer Neg Hx    Stomach cancer Neg Hx    Liver cancer Neg Hx    Pancreatic cancer Neg Hx      Social History   Socioeconomic History   Marital status: Married    Spouse name: Not on file   Number of children: 2   Years of education: Not on file   Highest education level: Not on file  Occupational History   Occupation: retired  Tobacco Use   Smoking status: Former     Current packs/day: 0.00    Average packs/day: 0.5 packs/day for 15.0 years (7.5 ttl pk-yrs)    Types: Cigarettes    Start date: 11/27/1963    Quit date: 11/27/1978    Years since quitting: 45.7   Smokeless tobacco: Former    Types: Chew    Quit date: 1992  Vaping Use   Vaping status: Never Used  Substance and Sexual Activity   Alcohol use: Yes    Comment: social- 2 drinks a month maybe    Drug use: Never   Sexual activity: Yes  Other Topics Concern   Not on file  Social History Narrative   Not on file   Social Drivers of Health   Tobacco Use: Medium Risk (08/06/2024)   Patient History    Smoking Tobacco Use: Former    Smokeless Tobacco Use: Former    Passive Exposure: Not on Stage Manager: Not on file  Food Insecurity: Not on file  Transportation Needs: Not on file  Physical Activity: Not on file  Stress: Not on file  Social Connections: Unknown (12/04/2021)   Received [facility]   Social Network  Social Network: Not on file  Intimate Partner Violence: Unknown (10/26/2021)   Received from [facility]   HITS    Physically Hurt: Not on file    Insult or Talk Down To: Not on file    Threaten Physical Harm: Not on file    Scream or Curse: Not on file  Depression (EYV7-0): Not on file  Alcohol Screen: Not on file  Housing: Not on file  Utilities: Not on file  Health Literacy: Not on file     Allergies[1]   Outpatient Medications Prior to Visit  Medication Sig Dispense Refill   amLODipine  (NORVASC ) 5 MG tablet Take 1 tablet (5 mg total) by mouth daily. 30 tablet 1   atorvastatin  (LIPITOR) 10 MG tablet Take 10 mg by mouth daily.     benazepril (LOTENSIN) 20 MG tablet Take 20 mg by mouth daily.     denosumab  (PROLIA ) 60 MG/ML SOSY injection Inject 60 mg into the skin every 6 (six) months.     HYDROcodone-acetaminophen  (NORCO) 7.5-325 MG tablet Take 1 tablet by mouth every 6 (six) hours as needed.     metoprolol  tartrate (LOPRESSOR ) 25 MG  tablet Take 1 tablet (25 mg total) by mouth 2 (two) times daily. 180 tablet 3   Tamsulosin  HCl (FLOMAX ) 0.4 MG CAPS Take 0.4 mg by mouth 2 (two) times daily.     triamterene -hydrochlorothiazide  (MAXZIDE -25) 37.5-25 MG tablet Take 1 tablet by mouth daily.     venlafaxine  XR (EFFEXOR -XR) 150 MG 24 hr capsule Take 150 mg by mouth daily.     zolpidem (AMBIEN) 5 MG tablet Take 5 mg by mouth daily.     No facility-administered medications prior to visit.        Objective:   Physical Exam  Vitals:   08/06/24 1523  BP: 118/84  Pulse: 76  SpO2: 97%  Weight: 191 lb (86.6 kg)  Height: 5' 8 (1.727 m)   Gen: Pleasant, well-nourished, in no distress,  normal affect  ENT: No lesions,  mouth clear,  oropharynx clear, no postnasal drip  Neck: No JVD, no stridor  Lungs: No use of accessory muscles, soft bibasilar inspiratory crackles that clear with a deep inspiration  Cardiovascular: RRR, heart sounds normal, no murmur or gallops, no peripheral edema  Musculoskeletal: No deformities, no cyanosis or clubbing  Neuro: alert, awake, non focal  Skin: Warm, no lesions or rash     Assessment & Plan:  Calcified pleural plaque due to asbestos exposure Mr. Mallari is here to reestablish care.  He has a significant asbestos exposure and evidence for pleural plaquing on chest imaging.  His pulmonary function testing is normal, stable compared with his prior 7 years ago.  Reassured him about this.  He needs a repeat CT chest and will need annual or at least semiannual testing going forward.  High-resolution scan ordered today.  He will follow-up after it is completed to review.  Did discuss with him the potential risk for evolving mesothelioma.    Lamar Chris, MD, PhD 08/06/2024, 3:42 PM [facility] [phone] or if no answer before 7:00PM call [phone] For any issues after 7:00PM please call eLink [phone]        [1] No Known Allergies  "

## 2024-08-06 NOTE — Patient Instructions (Signed)
 We reviewed your pulmonary function testing done 07/2024.  Your lung function remains normal and this is stable compared with your priors.  Good news.  We will plan to repeat a high-resolution CT scan of your chest to follow your pleural plaques and for any evidence of asbestosis. Please follow Dr. Shelah next available after your CT so we can review those results together.

## 2024-08-06 NOTE — Assessment & Plan Note (Signed)
 Donald [name] is here to reestablish care.  He has a significant asbestos exposure and evidence for pleural plaquing on chest imaging.  His pulmonary function testing is normal, stable compared with his prior 7 years ago.  Reassured him about this.  He needs a repeat CT chest and will need annual or at least semiannual testing going forward.  High-resolution scan ordered today.  He will follow-up after it is completed to review.  Did discuss with him the potential risk for evolving mesothelioma.

## 2024-08-13 ENCOUNTER — Ambulatory Visit
Admission: RE | Admit: 2024-08-13 | Discharge: 2024-08-13 | Disposition: A | Discharge status: Home / Self Care | Source: Ambulatory Visit | Attending: Emergency Medicine | Admitting: Emergency Medicine

## 2024-08-13 DIAGNOSIS — J920 Pleural plaque with presence of asbestos: Secondary | ICD-10-CM

## 2024-08-26 ENCOUNTER — Ambulatory Visit: Payer: Self-pay | Admitting: Emergency Medicine

## 2024-08-27 ENCOUNTER — Encounter: Payer: Self-pay | Admitting: Emergency Medicine

## 2024-08-27 ENCOUNTER — Ambulatory Visit: Admitting: Emergency Medicine

## 2024-08-27 VITALS — BP 134/80 | HR 61 | Ht 68.0 in | Wt 194.0 lb

## 2024-08-27 DIAGNOSIS — R911 Solitary pulmonary nodule: Secondary | ICD-10-CM | POA: Insufficient documentation

## 2024-08-27 DIAGNOSIS — J920 Pleural plaque with presence of asbestos: Secondary | ICD-10-CM

## 2024-08-27 NOTE — Assessment & Plan Note (Signed)
 Left upper lobe ground glass pulmonary nodule that looks like it may be larger than prior scan from 2021.  Discussed with him the differential diagnosis including a well-differentiated adenocarcinoma.  We will plan to repeat his CT at the 1 year mark.  If there is a change, any enlargement or more thickening then would favor navigational bronchoscopy versus referral to radiation oncology for discussion about possible empiric SBRT.

## 2024-08-27 NOTE — Progress Notes (Signed)
 "  Subjective:    Patient ID: Donald [name], male    DOB: [birth year], 79 y.o.   MRN: [id number]  HPI Donald [name] is a 79 year old man with a history of former tobacco use (10 pack years), hypertension, prostate cancer, melanoma, hyperlipidemia.  He has an occupational asbestos exposure with pleural plaquing that was first noted on chest x-rays as far back as 2002. 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  He presents today to reestablish care.  Last seen here in 02/2019 at which time we had followed stable pleural plaques and calcifications as well as a stable 5 mm lingular nodule.  No breathing limitations. No cough.   He had repeat pulmonary function testing done 07/27/2024 which I have reviewed and which show normal airflows without a bronchodilator response.  His lung volumes are normal, diffusing capacity is normal.  His FEV1 and FVC have not changed compared with 7 years ago.  His total lung capacity is stable as well.  ROV 08/27/2024 --follow-up visit for 79 year old gentleman with history of minimal former tobacco use, melanoma, prostate cancer and occupational asbestos exposure.  He has known pleural plaques with some calcification.  I saw him in mid January and we planned for a repeat CT scan of the chest as below.  CT scan of the chest with high-resolution cuts done on 08/13/2024 reviewed by me, shows no mediastinal or hilar lymphadenopathy.  He has bilateral calcified pleural plaques without any subpleural reticulation or ground glass or evidence of interstitial lung disease.  He has a 9 mm hazy nodule in the lingula that was measured at 5 mm in 2021 possibly consistent with a well-differentiated adenocarcinoma.  There is a 3 mm lingular nodule that is unchanged.   Review of Systems As per HPI  Past Medical History:  Diagnosis Date   Allergy    Arthritis    thumbs, neck,  knees   Depression    Diverticulitis    Diverticulosis    History of  kidney stones    Hyperlipidemia    Hypertension    Melanoma (HCC)    Prostate cancer (HCC) 2009   seed implant   Tubular adenoma of colon      Family History  Problem Relation Age of Onset   Heart disease Mother    Heart disease Father    Prostate cancer Brother    Lung cancer Brother    Colon cancer Neg Hx    Colon polyps Neg Hx    Esophageal cancer Neg Hx    Rectal cancer Neg Hx    Stomach cancer Neg Hx    Liver cancer Neg Hx    Pancreatic cancer Neg Hx      Social History   Socioeconomic History   Marital status: Married    Spouse name: Not on file   Number of children: 2   Years of education: Not on file   Highest education level: Not on file  Occupational History   Occupation: retired  Tobacco Use   Smoking status: Former    Current packs/day: 0.00    Average packs/day: 0.5 packs/day for 15.0 years (7.5 ttl pk-yrs)    Types: Cigarettes    Start date: 11/27/1963    Quit date: 11/27/1978    Years since quitting: 45.7   Smokeless tobacco: Former    Types: Chew    Quit date: 1992  Vaping Use   Vaping status: Never Used  Substance and Sexual Activity  Alcohol use: Yes    Comment: social- 2 drinks a month maybe    Drug use: Never   Sexual activity: Yes  Other Topics Concern   Not on file  Social History Narrative   Not on file   Social Drivers of Health   Tobacco Use: Medium Risk (08/27/2024)   Patient History    Smoking Tobacco Use: Former    Smokeless Tobacco Use: Former    Passive Exposure: Not on Stage Manager: Not on [employer] Insecurity: Not on file  Transportation Needs: Not on file  Physical Activity: Not on file  Stress: Not on file  Social Connections: Unknown (12/04/2021)   [facility]   Social Network    Social Network: Not on file  Intimate Partner Violence: Unknown (10/26/2021)   Received from [facility]   HITS    Physically Hurt: Not on file    Insult or Talk Down To: Not on file    Threaten  Physical Harm: Not on file    Scream or Curse: Not on file  Depression (EYV7-0): Not on file  Alcohol Screen: Not on file  Housing: Not on file  Utilities: Not on file  Health Literacy: Not on file     Allergies[1]   Outpatient Medications Prior to Visit  Medication Sig Dispense Refill   amLODipine  (NORVASC ) 5 MG tablet Take 1 tablet (5 mg total) by mouth daily. 30 tablet 1   atorvastatin  (LIPITOR) 10 MG tablet Take 10 mg by mouth daily.     benazepril (LOTENSIN) 20 MG tablet Take 20 mg by mouth daily.     denosumab  (PROLIA ) 60 MG/ML SOSY injection Inject 60 mg into the skin every 6 (six) months.     HYDROcodone-acetaminophen  (NORCO) 7.5-325 MG tablet Take 1 tablet by mouth every 6 (six) hours as needed.     ibandronate (BONIVA) 150 MG tablet 1 tablet 60 minutes before the first food, beverage or medicine of the day with plain water  Orally once a month; Duration: 90 days     metoprolol  tartrate (LOPRESSOR ) 25 MG tablet Take 1 tablet (25 mg total) by mouth 2 (two) times daily. 180 tablet 3   Tamsulosin  HCl (FLOMAX ) 0.4 MG CAPS Take 0.4 mg by mouth 2 (two) times daily.     venlafaxine  XR (EFFEXOR -XR) 150 MG 24 hr capsule Take 150 mg by mouth daily.     zolpidem (AMBIEN) 5 MG tablet Take 5 mg by mouth daily.     triamterene -hydrochlorothiazide  (MAXZIDE -25) 37.5-25 MG tablet Take 1 tablet by mouth daily.     No facility-administered medications prior to visit.        Objective:   Physical Exam  Vitals:   08/27/24 1130  BP: (!) 149/74  Pulse: 61  SpO2: 90%  Weight: 194 lb (88 kg)  Height: 5' 8 (1.727 m)   Gen: Pleasant, well-nourished, in no distress,  normal affect  ENT: No lesions,  mouth clear,  oropharynx clear, no postnasal drip  Neck: No JVD, no stridor  Lungs: No use of accessory muscles, soft bibasilar inspiratory crackles that clear with a deep inspiration  Cardiovascular: RRR, heart sounds normal, no murmur or gallops, no peripheral edema  Musculoskeletal: No  deformities, no cyanosis or clubbing  Neuro: alert, awake, non focal  Skin: Warm, no lesions or rash     Assessment & Plan:  Calcified pleural plaque due to asbestos exposure No evidence of progression of his pleural disease, no evidence of  asbestosis or interstitial lung disease.  Reassured him about this.  He is not having any respiratory symptoms.  He does have a ground glass left upper lobe pulmonary nodule that will need to be followed as below.  Pulmonary nodule Left upper lobe ground glass pulmonary nodule that looks like it may be larger than prior scan from 2021.  Discussed with him the differential diagnosis including a well-differentiated adenocarcinoma.  We will plan to repeat his CT at the 1 year mark.  If there is a change, any enlargement or more thickening then would favor navigational bronchoscopy versus referral to radiation oncology for discussion about possible empiric SBRT.    I personally spent a total of 31 minutes in the care of the patient today including preparing to see the patient, getting/reviewing separately obtained history, performing a medically appropriate exam/evaluation, counseling and educating, placing orders, referring and communicating with other health care professionals, documenting clinical information in the EHR, independently interpreting results, and communicating results.   Lamar Chris, MD, PhD 08/27/2024, 11:58 AM [facility] [phone] or if no answer before 7:00PM call [phone] For any issues after 7:00PM please call eLink [phone]         [1] No Known Allergies  "

## 2024-08-27 NOTE — Assessment & Plan Note (Signed)
 No evidence of progression of his pleural disease, no evidence of asbestosis or interstitial lung disease.  Reassured him about this.  He is not having any respiratory symptoms.  He does have a ground glass left upper lobe pulmonary nodule that will need to be followed as below.

## 2024-08-27 NOTE — Patient Instructions (Signed)
 We reviewed your CT scan of the chest today. There has been no significant change in your pleural thickening.  There is no evidence of interstitial lung disease associated with asbestos.  This is good news. There is a small pulmonary nodule in the left upper lobe that we will follow with a repeat CT scan in January 2027. Follow Dr. Shelah in January 2027 after the scan so we can review

## 2024-08-27 NOTE — Progress Notes (Signed)
 Pt has ov today to review results with Dr. Shelah  Nothing further needed.

## 2024-09-17 ENCOUNTER — Ambulatory Visit: Admitting: Emergency Medicine

## 2024-12-11 ENCOUNTER — Ambulatory Visit: Admitting: Physician Assistant
